# Patient Record
Sex: Male | Born: 1945 | Race: White | Hispanic: No | Marital: Married | Smoking: Never smoker
Health system: Southern US, Community
[De-identification: ages and names within clinical notes are randomized; demographics above are authoritative.]

## PROBLEM LIST (undated history)

## (undated) DIAGNOSIS — I77810 Thoracic aortic ectasia: Secondary | ICD-10-CM

## (undated) DIAGNOSIS — E119 Type 2 diabetes mellitus without complications: Secondary | ICD-10-CM

## (undated) DIAGNOSIS — A809 Acute poliomyelitis, unspecified: Secondary | ICD-10-CM

## (undated) DIAGNOSIS — I10 Essential (primary) hypertension: Secondary | ICD-10-CM

## (undated) DIAGNOSIS — J449 Chronic obstructive pulmonary disease, unspecified: Secondary | ICD-10-CM

## (undated) DIAGNOSIS — E785 Hyperlipidemia, unspecified: Secondary | ICD-10-CM

## (undated) DIAGNOSIS — K219 Gastro-esophageal reflux disease without esophagitis: Secondary | ICD-10-CM

## (undated) DIAGNOSIS — I219 Acute myocardial infarction, unspecified: Secondary | ICD-10-CM

## (undated) DIAGNOSIS — I2510 Atherosclerotic heart disease of native coronary artery without angina pectoris: Secondary | ICD-10-CM

## (undated) HISTORY — DX: Acute poliomyelitis, unspecified: A80.9

## (undated) HISTORY — DX: Chronic obstructive pulmonary disease, unspecified: J44.9

## (undated) HISTORY — DX: Acute myocardial infarction, unspecified: I21.9

## (undated) HISTORY — DX: Type 2 diabetes mellitus without complications: E11.9

## (undated) HISTORY — DX: Gastro-esophageal reflux disease without esophagitis: K21.9

## (undated) HISTORY — DX: Thoracic aortic ectasia: I77.810

## (undated) HISTORY — DX: Atherosclerotic heart disease of native coronary artery without angina pectoris: I25.10

## (undated) HISTORY — DX: Essential (primary) hypertension: I10

## (undated) HISTORY — PX: BACK SURGERY: SHX140

## (undated) HISTORY — DX: Hyperlipidemia, unspecified: E78.5

## (undated) HISTORY — PX: CARDIAC SURGERY: SHX584

---

## 1994-08-31 HISTORY — PX: CORONARY ARTERY BYPASS GRAFT: SHX141

## 1999-03-31 ENCOUNTER — Ambulatory Visit (HOSPITAL_COMMUNITY): Admission: RE | Admit: 1999-03-31 | Discharge: 1999-03-31 | Payer: Self-pay | Admitting: Cardiology

## 2000-08-31 HISTORY — PX: BACK SURGERY: SHX140

## 2000-12-03 ENCOUNTER — Encounter: Admission: RE | Admit: 2000-12-03 | Discharge: 2000-12-03 | Payer: Self-pay | Admitting: Family Medicine

## 2000-12-03 ENCOUNTER — Encounter: Payer: Self-pay | Admitting: Family Medicine

## 2001-01-05 ENCOUNTER — Encounter: Payer: Self-pay | Admitting: Neurosurgery

## 2001-01-06 ENCOUNTER — Ambulatory Visit (HOSPITAL_COMMUNITY): Admission: RE | Admit: 2001-01-06 | Discharge: 2001-01-07 | Payer: Self-pay | Admitting: Neurosurgery

## 2001-01-06 ENCOUNTER — Encounter: Payer: Self-pay | Admitting: Neurosurgery

## 2003-06-03 ENCOUNTER — Emergency Department (HOSPITAL_COMMUNITY): Admission: EM | Admit: 2003-06-03 | Discharge: 2003-06-04 | Payer: Self-pay | Admitting: Emergency Medicine

## 2003-07-13 ENCOUNTER — Encounter: Admission: RE | Admit: 2003-07-13 | Discharge: 2003-07-13 | Payer: Self-pay | Admitting: Family Medicine

## 2009-08-31 HISTORY — PX: CARDIAC CATHETERIZATION: SHX172

## 2009-10-26 ENCOUNTER — Inpatient Hospital Stay (HOSPITAL_COMMUNITY): Admission: EM | Admit: 2009-10-26 | Discharge: 2009-10-28 | Payer: Self-pay | Admitting: Emergency Medicine

## 2010-10-26 IMAGING — CR DG CHEST 2V
2 series · 2 of 2 positions shown · non-contrast
Comparison: Report dated 07/13/2003.

CLINICAL DATA: Generalized chest pain.

CHEST - 2 VIEW

[w chest pa]
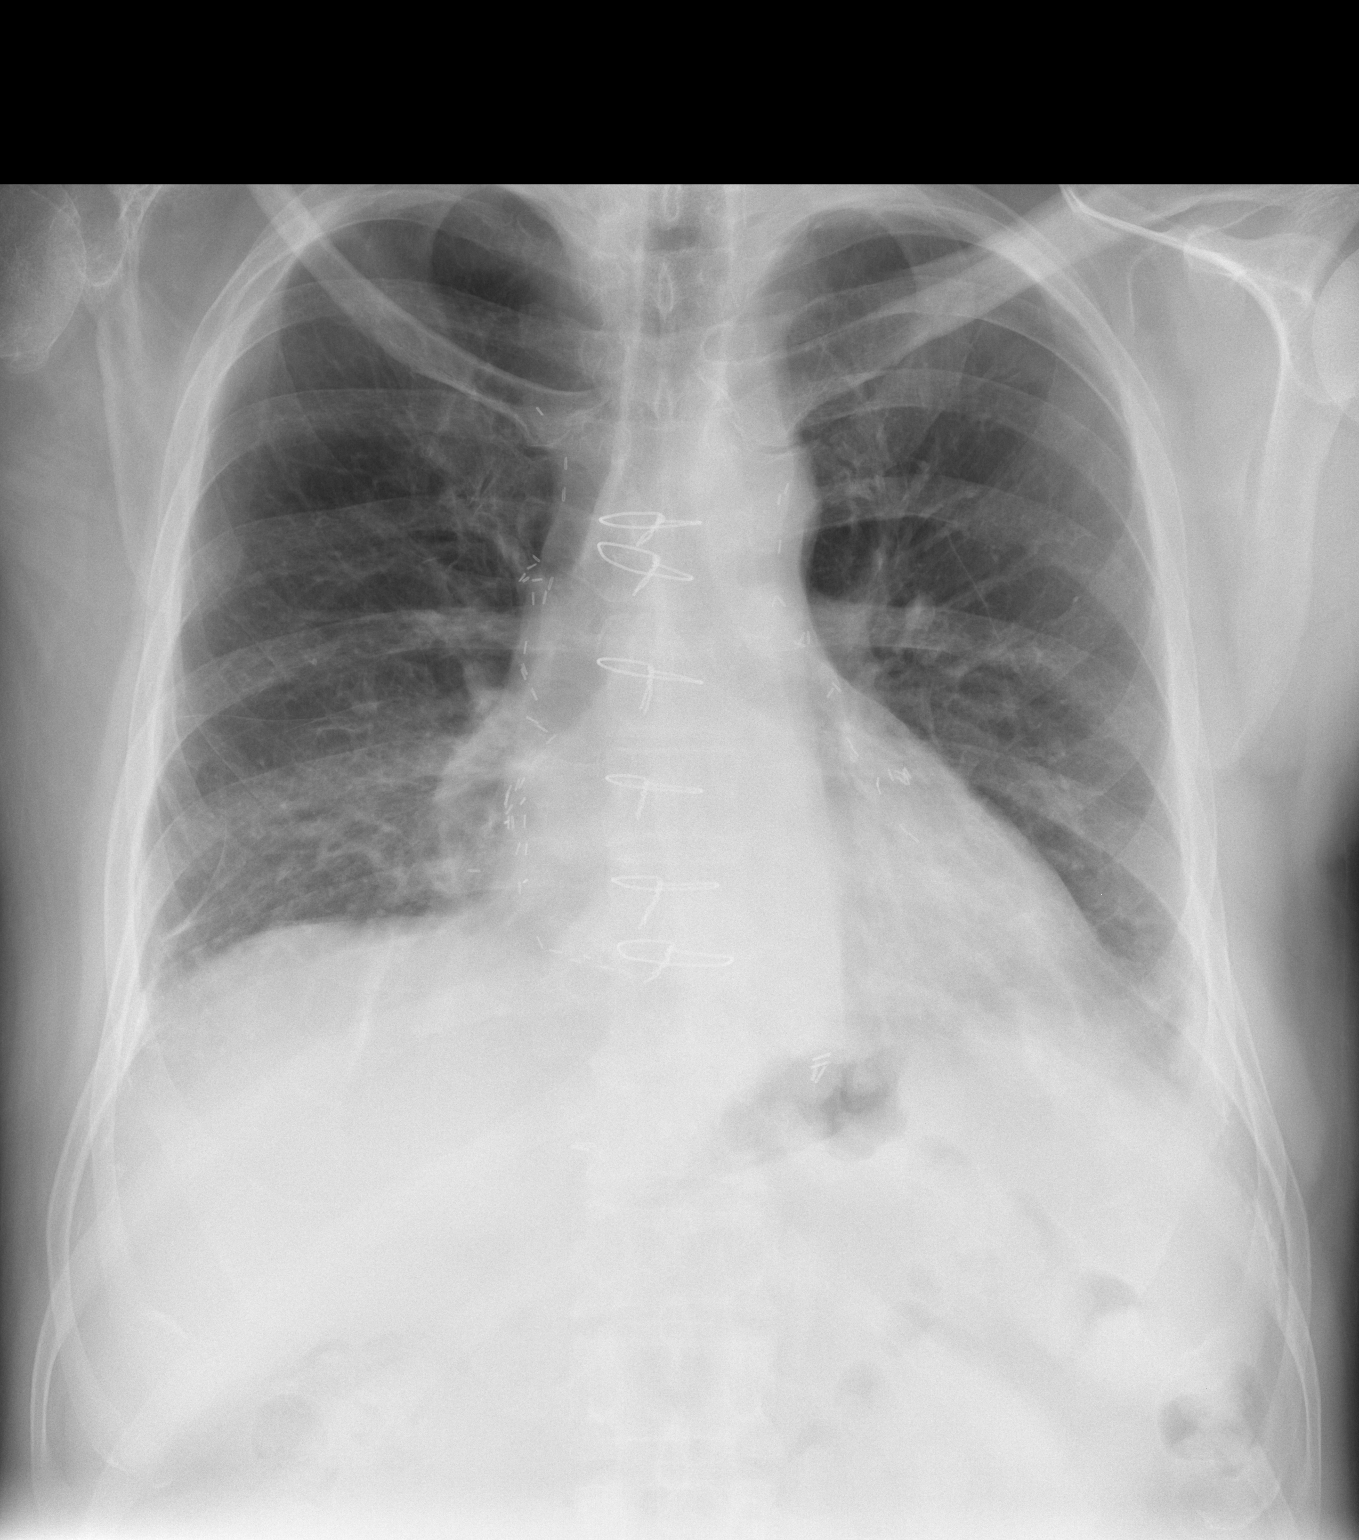

[w chest lat]
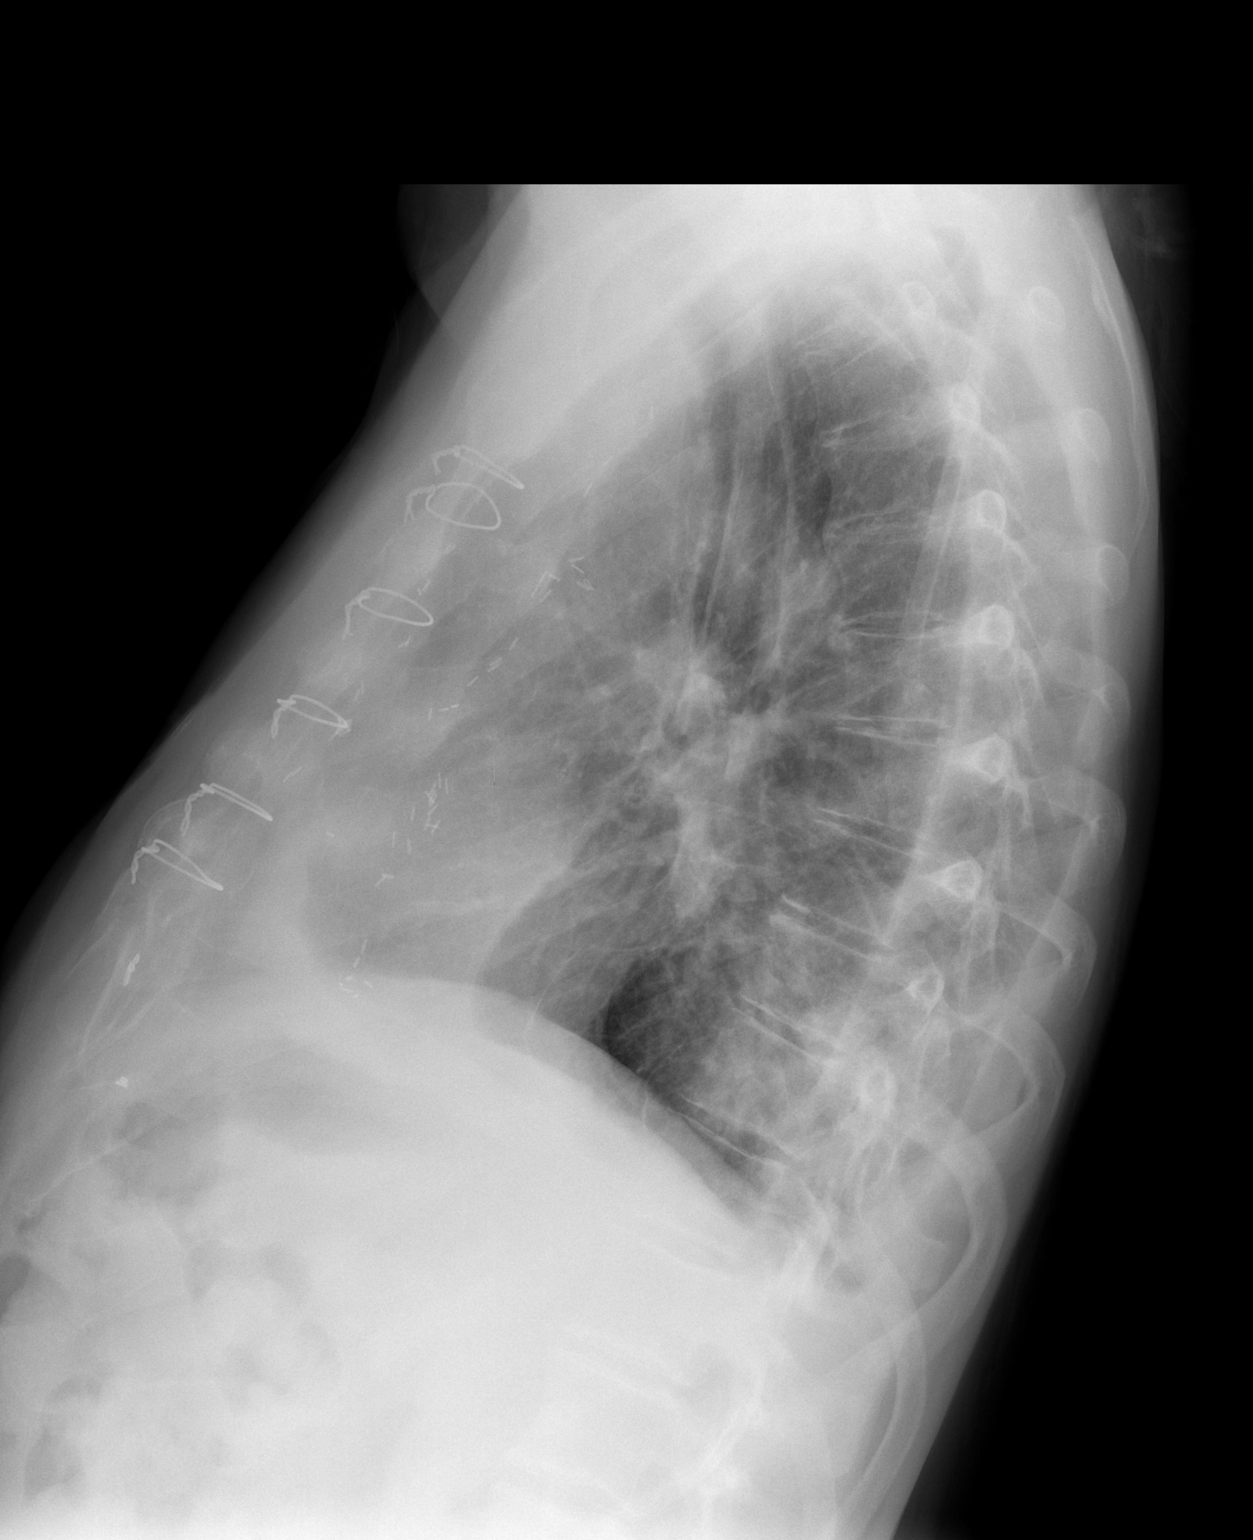

[2 of 2 positions shown; findings below may reference images not displayed]

FINDINGS: Borderline enlarged cardiac silhouette.  Post CABG
changes.  Linear densities at both lung bases.  There is also ill-
defined density at the posterior lung bases on the lateral view.
Small amount of bilateral pleural thickening or fluid.  Diffuse
osteopenia.  Mild thoracic spine degenerative changes peri
IMPRESSION: 1.  Posterior basilar atelectasis, scarring or pneumonia on the
lateral view.
2.  Bibasilar atelectasis or scarring on the frontal view.
3.  Small amount of bilateral pleural thickening or fluid.

## 2010-11-19 LAB — BASIC METABOLIC PANEL
BUN: 17 mg/dL (ref 6–23)
Chloride: 108 mEq/L (ref 96–112)
GFR calc non Af Amer: >60 mL/min (ref >60)
Potassium: 3.7 mEq/L (ref 3.5–5.1)
Sodium: 139 mEq/L (ref 135–145)

## 2010-11-19 LAB — CARDIAC PANEL(CRET KIN+CKTOT+MB+TROPI)
CK, MB: 0.7 ng/mL (ref 0.3–4.0)
Troponin I: 0.01 ng/mL (ref 0.00–0.06)

## 2010-11-19 LAB — CBC
HCT: 45.2 % (ref 39.0–52.0)
HCT: 49.5 % (ref 39.0–52.0)
Hemoglobin: 15.9 g/dL (ref 13.0–17.0)
Hemoglobin: 17.3 g/dL — ABNORMAL HIGH (ref 13.0–17.0)
MCHC: 35 g/dL (ref 30.0–36.0)
MCV: 92.8 fL (ref 78.0–100.0)
MCV: 93.1 fL (ref 78.0–100.0)
Platelets: 173 10*3/uL (ref 150–400)
Platelets: 192 10*3/uL (ref 150–400)
RBC: 4.87 MIL/uL (ref 4.22–5.81)
RBC: 5.31 MIL/uL (ref 4.22–5.81)
RDW: 12.7 % (ref 11.5–15.5)
WBC: 10.2 10*3/uL (ref 4.0–10.5)
WBC: 10.6 10*3/uL — ABNORMAL HIGH (ref 4.0–10.5)

## 2010-11-19 LAB — GLUCOSE, CAPILLARY
Glucose-Capillary: 101 mg/dL — ABNORMAL HIGH (ref 70–99)
Glucose-Capillary: 74 mg/dL (ref 70–99)

## 2010-11-19 LAB — MAGNESIUM: Magnesium: 1.8 mg/dL (ref 1.5–2.5)

## 2010-11-19 LAB — DIFFERENTIAL
Basophils Absolute: 0.1 10*3/uL (ref 0.0–0.1)
Basophils Relative: 1 % (ref 0–1)
Neutro Abs: 7.1 10*3/uL (ref 1.7–7.7)
Neutrophils Relative %: 70 % (ref 43–77)

## 2010-11-19 LAB — COMPREHENSIVE METABOLIC PANEL
Alkaline Phosphatase: 91 U/L (ref 39–117)
BUN: 10 mg/dL (ref 6–23)
CO2: 24 mEq/L (ref 19–32)
Chloride: 103 mEq/L (ref 96–112)
Glucose, Bld: 187 mg/dL — ABNORMAL HIGH (ref 70–99)
Potassium: 4.2 mEq/L (ref 3.5–5.1)
Total Bilirubin: 1.2 mg/dL (ref 0.3–1.2)

## 2010-11-19 LAB — PROTIME-INR: INR: 1 (ref 0.00–1.49)

## 2010-11-19 LAB — TROPONIN I: Troponin I: 0.04 ng/mL (ref 0.00–0.06)

## 2010-11-19 LAB — LIPID PANEL
HDL: 30 mg/dL — ABNORMAL LOW (ref >39)
LDL Cholesterol: 175 mg/dL — ABNORMAL HIGH (ref 0–99)
Triglycerides: 182 mg/dL — ABNORMAL HIGH (ref <150)

## 2010-11-19 LAB — POCT CARDIAC MARKERS: Troponin i, poc: <0.05 ng/mL (ref 0.00–0.09)

## 2010-11-19 LAB — CK TOTAL AND CKMB (NOT AT ARMC)
CK, MB: 1 ng/mL (ref 0.3–4.0)
Total CK: 41 U/L (ref 7–232)

## 2010-11-19 LAB — APTT: aPTT: 30 seconds (ref 24–37)

## 2010-11-19 LAB — HEMOGLOBIN A1C: Hgb A1c MFr Bld: 8.2 % — ABNORMAL HIGH (ref 4.6–6.1)

## 2011-01-16 NOTE — H&P (Signed)
[place]. [facility]  Patient:    Edward Russo, Edward Russo                       MRN: [id number] Adm. Date:  [id number] Attending:  Colon Branch                         History and Physical  CHIEF COMPLAINT:  Right leg pain.  HISTORY:  Patient is a 65 year old gentleman who for the last three months or so has had a right-sided back, hip, and leg pain, been going down behind the knee, occasionally going to the bottom of the foot, intermittent numbness on the bottom of the foot.  There has been worsening over this period of time despite nonsteroidal anti-inflammatories and pain medication.  No left-sided symptoms. Leg pain is more severe than any back pain.  Denies any weakness. No bowel or bladder complaints.  MRI was done showing disk herniation right side at L5-S1 compressing the S1 root and patient admitted for surgery.  PAST MEDICAL HISTORY:  Significant for hypertension, non-insulin-dependent diabetes, and coronary artery disease.  PREVIOUS OPERATIONS:  Double bypass in 1996.  He has a stress test and another catheterization two years ago and then he did undergo cardiac consultation one week ago and was cleared for surgery.  CURRENT MEDICATIONS:  Lipitor, Cardizem, and Amaryl.  DRUG ALLERGIES:  CODEINE - which actually causes constipation, not a real allergy.  SOCIAL HISTORY:  Shows he is married, employed as an Airline pilot, and does not smoke or drink alcohol.  REVIEW OF SYSTEMS:  Otherwise negative.  FAMILY HISTORY:  Noncontributory.  PHYSICAL EXAMINATION:  GENERAL:  Patient sits leaning to the left in some mild distress.  VITAL SIGNS:  Weight 195, height 5 feet 10 inches.  Blood pressure 144/90, pulse 62, temperature 98.5.  HEENT:  Unremarkable.  LUNGS:  Clear.  HEART:  Regular rhythm.  ABDOMEN:  Soft, nontender.  EXTREMITIES:  Intact and no edema.  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  BACK AND  NEUROLOGIC:  Show decreased range of motion of the back in extension due to increasing radicular pain.  Straight leg raising is positive on the right, negative on the left.  Motor strength shows 5/5 in all muscle groups and he can get up on his toes 10 times on each foot though with a little more effort on the right side.  Sensation is slightly decreased to light touch in the right S1 distribution; otherwise intact.  He is intact to pinprick.  Deep tendon reflexes are equal bilaterally at the ankles and knees.  Gait is done fairly normally.  ASSESSMENT AND PLAN:  Patient with a herniated disk right L5-S1 with right S1 radiculopathy and patient admitted for surgery. DD:  01/06/01 TD:  01/07/01 Job: [id number]

## 2011-01-16 NOTE — Discharge Summary (Signed)
[place]. [facility]  Patient:    Edward Russo, Edward Russo                       MRN: [id number] Adm. Date:  [id number] Disc. Date: [id number] Attending:  Colon Branch                           Discharge Summary  NO DICTATION DD:  02/01/01 TD:  02/01/01 Job: [id number]

## 2011-01-16 NOTE — Cardiovascular Report (Signed)
[place]. [facility]  Patient:    Edward Russo, Edward Russo                       MRN: [id number] Proc. Date: 03/31/99 Adm. Date:  [id number] Disc. Date: [id number] Attending:  Colon Branch                        Cardiac Catheterization  PROCEDURE PERFORMED: 1. Selective coronary angiography by Judkins technique. 2. Retrograde left heart catheterization. 3. Left ventricular angiography. 4. Selective visualization of a right internal mammary artery graft and a left    internal mammary artery graft.  COMPLICATIONS:  None.  ENTRY SITE:  Right femoral.  DYE USED:  Omnipaque.  HISTORY OF PRESENT ILLNESS:  The patient is a 65 year old gentleman with coronary artery disease and coronary artery bypass grafting in 1996. Cardiolite stress testing on March 27, 1999, showed a left ventricular ejection fraction of 57% and suggested evidence of ischemia in the left anterior descending coronary artery territory. This cardiac catheterization was therefore performed. The patient did have angioplasty in two sites of his right coronary artery.  RESULTS: 1. Pressures:  The left ventricular pressure was 144/22, central aortic    pressure was 130/81. 2. During left ventricular to central aorta pullback, there was no gradient    noted, however.  ANGIOGRAPHIC RESULTS:  The left main coronary artery was short with no disease noted.  The left anterior descending coronary artery was a small vessel. There was diffuse disease in this vessel. There was noted to be a distal LAD well filled by a patent internal mammary artery graft that could be seen to fill in a retrograde fashion.  The left circumflex coronary artery gave rise to an intermediate ______ branch and was a nondominant vessel. There was felt to be a moderate ostial stenosis in this nondominant vessel of somewhere less than 75%.  The right coronary artery was diseased in its midportion, and there was disease noted in the  posterior descending branch, as well.  The left internal mammary artery graft was patent.  The right internal mammary artery graft to the distal RCA was also patent.  Left ventricular ejection fraction was calculated at 72%.  FINAL IMPRESSIONS: 1. Coronary artery disease involving two vessels severely, specifically the    left anterior descending and the right coronary artery. 2. Well-preserved left ventricular systolic function. 3. Patent internal mammary artery grafts to the RCA and to the LAD. 4. Nondominant circumflex system with mild disease present.  PLAN:  Medical therapy. DD:  02/01/01 TD:  02/01/01 Job: [id number]

## 2011-01-16 NOTE — Op Note (Signed)
[place]. [facility]  Patient:    Edward Russo, Edward Russo                       MRN: [id number] Proc. Date: 01/06/01 Adm. Date:  [id number] Attending:  Colon Branch                           Operative Report  PREOPERATIVE DIAGNOSIS:  Herniated nucleus pulposus, right L5-S1.  POSTOPERATIVE DIAGNOSIS:  Herniated nucleus pulposus, right L5-S1.  PROCEDURE:  Right L5-S1 semihemilaminectomy and diskectomy, microdissection with microscope.  SURGEON:  Clydene Fake, M.D.  ASSISTANT:  Izell Geneva. Elesa Hacker, M.D.  ANESTHESIA:  General endotracheal tube anesthesia.  ESTIMATED BLOOD LOSS:  Minimal.  BLOOD GIVEN:  None.  DRAINS:  None.  COMPLICATIONS:  None.  REASON FOR PROCEDURE:  Patient is a 65 year old gentleman who has had three months of right leg pain in an S1 distribution with some intermittent numbness.  An MRI showed a corresponding disk herniation at the right side of L5-S1.  Patient brought in for surgery.  DESCRIPTION OF PROCEDURE:  The patient was brought into the operating room, and general anesthesia was induced.  The patient was placed in a prone position on a Wilson frame and all pressure points padded.  The patient was prepped and draped in a sterile fashion.  A needle was placed at the 5-1 interspace, x-ray obtained showing that this was the correct level.  An incision was made centered over this area.  Incision taken down to the fascia and hemostasis obtained with Bovie cauterization.  The right side of the fascia was incised with the Bovie, and subperiosteal dissection was done over the L5 and S1 spinous processes and laminae out to the facet.  A marker was placed at the interspace, and an x-ray was obtained again, confirming this was the 5-1 interspace.  The microscope was brought in for microdissection at this point, and the high-speed drill was used to start the semihemilaminectomy.  A Kerrison punch was used to finish this, and the ligamentum  flavum was then removed, and we were looking at the dura.  We dissected down to the S1 nerve root, and there was some tension on that.  As we carefully reflected the nerve root medially, there was a protrusion of disk coming out from the disk space. We were able to grab onto this protrusion and pull the disk material out.  We then incised the disk space and performed a diskectomy with pituitary rongeurs and curettes.  After we were satisfied with the diskectomy, we followed the L5 and S1 roots out, and there was no tension on it any longer.  We irrigated with antibiotic solution.  Hemostasis was obtained with bipolar cauterization and Gelfoam and thrombin, and Gelfoam was then irrigated out and one small piece was left just lateral to the S1 root.  The retractors were then removed.  The wound was irrigated with antibiotic solution, and the fascia was closed with 0 Vicryl interrupted sutures, and the subcutaneous tissue was closed with 0, 2-0, and 3-0 Vicryl interrupted sutures and the skin closed with Steri-Strips.  Dressing was placed.  The patient was placed back in the supine position, awoken from anesthesia, and transferred to recovery room in stable condition. DD:  01/06/01 TD:  01/07/01 Job: [id number]

## 2011-01-16 NOTE — Discharge Summary (Signed)
[place]. [facility]  Patient:    Edward Russo, Edward Russo                       MRN: [id number] Adm. Date:  [id number] Disc. Date: [id number] Attending:  Colon Branch                           Discharge Summary  ADMISSION DIAGNOSIS:  Herniated nucleus pulposus right L5-S1.  DISCHARGE DIAGNOSIS:  Herniated nucleus pulposus right L5-S1.  PROCEDURE:  Right L5-S1 semi-hemilaminectomy and diskectomy with microdissection with microscope.  REASON FOR ADMISSION:  The patient is a 65 year old gentleman who has had three months of right-sided back, hip and leg pain going towards the bottom of his foot with some numbness there.  The patient had an MRI showing right L5-S1 disc herniation and was brought in for surgery.  HOSPITAL COURSE:  The patient was brought in on the day of surgery and underwent the procedure above without complications.  Postop, the patient was transferred to the recovery room and then to the floor.  There he has been ambulating, eating, having much less leg pain.  He does have some pain over in the right tip, but even that is not as severe as preoperatively.  He does have some mild incisional pain.  He has been eating well.  His incision is clean, dry and intact.  Blood sugars have been stable.  Cardiac wise he has been stable.  He will be discharged home in stable condition.  DISCHARGE MEDICATIONS: 1. Resume medications as of prehospitalization. 2. Voltaren p.r.n. 3. Vicodin ES one to two p.o. q.4-6h. p.r.n., 51 given. 4. Flexeril 10 mg q.8h. p.r.n. spasms.  DIET:  As tolerated.  ACTIVITY:  No strenuous activity.  No sitting longer than 20 minutes.  No bending or twisting.  May ambulate.  WOUND CARE:  Keep incision dry for five days.  FOLLOWUP:  Follow up in three weeks with Dr. Phoebe Perch in his office. DD: 01/07/01 TD:  01/10/01 Job: [id number]

## 2014-02-14 ENCOUNTER — Emergency Department (HOSPITAL_COMMUNITY)
Admission: EM | Admit: 2014-02-14 | Discharge: 2014-02-14 | Disposition: A | Discharge status: Home / Self Care | Payer: Medicare HMO | Source: Home / Self Care | Attending: Family Medicine | Admitting: Family Medicine

## 2014-02-14 ENCOUNTER — Encounter (HOSPITAL_COMMUNITY): Payer: Self-pay | Admitting: Emergency Medicine

## 2014-02-14 DIAGNOSIS — W260XXA Contact with knife, initial encounter: Secondary | ICD-10-CM

## 2014-02-14 DIAGNOSIS — W261XXA Contact with sword or dagger, initial encounter: Secondary | ICD-10-CM

## 2014-02-14 DIAGNOSIS — S61411A Laceration without foreign body of right hand, initial encounter: Secondary | ICD-10-CM

## 2014-02-14 DIAGNOSIS — S61409A Unspecified open wound of unspecified hand, initial encounter: Secondary | ICD-10-CM

## 2014-02-14 HISTORY — DX: Essential (primary) hypertension: I10

## 2014-02-14 NOTE — Discharge Instructions (Signed)

## 2014-02-14 NOTE — ED Notes (Signed)
Pt  Sustained  A  Laceration   To  r hand       Cut  With     A  Razor  Blade        rom  Is  Present

## 2014-02-14 NOTE — ED Provider Notes (Signed)
CSN: [id number]     Arrival date & time 02/14/14  0907 History   First MD Initiated Contact with Patient 02/14/14 [phone]     Chief Complaint  Patient presents with  . Laceration   (Consider location/radiation/quality/duration/timing/severity/associated sxs/prior Treatment) Patient is a 68 y.o. male presenting with skin laceration. The history is provided by the patient. No language interpreter was used.  Laceration Location:  Hand Hand laceration location:  R hand Length (cm):  2 Quality: straight   Bleeding: controlled   Time since incident:  1 hour Laceration mechanism:  Knife Pain details:    Quality:  Aching   Severity:  Moderate   Progression:  Worsening Foreign body present:  No foreign bodies Relieved by:  Nothing Ineffective treatments:  None tried Tetanus status:  Up to date Pt cut his hand cutting up a credit card with a knife  Past Medical History  Diagnosis Date  . Hypertension    Past Surgical History  Procedure Laterality Date  . Cardiac surgery    . Back surgery     History reviewed. No pertinent family history. History  Substance Use Topics  . Smoking status: Never Smoker   . Smokeless tobacco: Not on file  . Alcohol Use: No    Review of Systems  Skin: Positive for wound.  All other systems reviewed and are negative.   Allergies  Review of patient's allergies indicates no known allergies.  Home Medications   Prior to Admission medications   Medication Sig Start Date End Date Taking? Authorizing Provider  atorvastatin (LIPITOR) 40 MG tablet Take 40 mg by mouth daily.   Yes Historical Provider, MD  CARVEDILOL PO Take by mouth.   Yes Historical Provider, MD  METFORMIN HCL PO Take by mouth.   Yes Historical Provider, MD  nitroGLYCERIN (NITROSTAT) 0.4 MG SL tablet Place 0.4 mg under the tongue every 5 (five) minutes as needed for chest pain.   Yes Historical Provider, MD  omeprazole (PRILOSEC) 20 MG capsule Take 20 mg by mouth daily.   Yes  Historical Provider, MD  Ranolazine (RANEXA PO) Take by mouth.   Yes Historical Provider, MD  tamsulosin (FLOMAX) 0.4 MG CAPS capsule Take 0.4 mg by mouth.   Yes Historical Provider, MD   BP 134/78  Pulse 77  Temp(Src) 97.2 F (36.2 C) (Oral)  Resp 18  SpO2 96% Physical Exam  Constitutional: He is oriented to person, place, and time. He appears well-developed and well-nourished.  HENT:  Head: Normocephalic.  Eyes: Pupils are equal, round, and reactive to light.  Neck: Normal range of motion.  Pulmonary/Chest: Effort normal.  Musculoskeletal:  1.5 cm laceration right hand  Neurological: He is alert and oriented to person, place, and time. He has normal reflexes.  Skin: Skin is warm.  Psychiatric: He has a normal mood and affect.    ED Course  LACERATION REPAIR Date/Time: 02/14/2014 9:49 AM Performed by: Elson AreasSOFIA, LESLIE K Authorized by: Clementeen GrahamOREY, EVAN, S Consent: Verbal consent not obtained. Patient understanding: patient does not state understanding of the procedure being performed Patient identity confirmed: verbally with patient Time out: Immediately prior to procedure a "time out" was called to verify the correct patient, procedure, equipment, support staff and site/side marked as required. Body area: upper extremity Laceration length: 1 cm Tendon involvement: none Nerve involvement: none Vascular damage: no Anesthesia: local infiltration Preparation: Patient was prepped and draped in the usual sterile fashion. Irrigation solution: saline Amount of cleaning: standard Skin closure: 5-0 Prolene Number of  sutures: 3 Technique: simple Approximation: close Approximation difficulty: simple   (including critical care time) Labs Review Labs Reviewed - No data to display  Imaging Review No results found.   MDM   1. Laceration of right hand        Elson AreasLeslie K Sofia, [place] JerseyPA-C 02/14/14 [id number]

## 2014-02-14 NOTE — ED Provider Notes (Signed)
Medical screening examination/treatment/procedure(s) were performed by a resident physician or non-physician practitioner and as the supervising physician I was immediately available for consultation/collaboration.  Zayvion Stailey, MD    Joaquim Tolen S Chrislyn Seedorf, MD 02/14/14 2013 

## 2014-02-22 ENCOUNTER — Emergency Department (INDEPENDENT_AMBULATORY_CARE_PROVIDER_SITE_OTHER)
Admission: EM | Admit: 2014-02-22 | Discharge: 2014-02-22 | Disposition: A | Discharge status: Home / Self Care | Payer: Commercial Managed Care - HMO | Source: Home / Self Care | Attending: Family Medicine | Admitting: Family Medicine

## 2014-02-22 ENCOUNTER — Encounter (HOSPITAL_COMMUNITY): Payer: Self-pay | Admitting: Emergency Medicine

## 2014-02-22 DIAGNOSIS — Z4802 Encounter for removal of sutures: Secondary | ICD-10-CM

## 2014-02-22 DIAGNOSIS — S61411D Laceration without foreign body of right hand, subsequent encounter: Secondary | ICD-10-CM

## 2014-02-22 NOTE — ED Notes (Signed)
C/o suture removal of stitches in the right palm hand States area feels better

## 2014-02-22 NOTE — ED Provider Notes (Signed)
Edward Russo is a 68 y.o. male who presents to Urgent Care today for  suture removal. Patient suffered a laceration to his right thenar eminence about one week ago. He is here today for suture removal. He denies any pain weakness or numbness fevers or chills.   Past Medical History  Diagnosis Date  . Hypertension    History  Substance Use Topics  . Smoking status: Never Smoker   . Smokeless tobacco: Not on file  . Alcohol Use: No   ROS as above Medications: No current facility-administered medications for this encounter.   Current Outpatient Prescriptions  Medication Sig Dispense Refill  . atorvastatin (LIPITOR) 40 MG tablet Take 40 mg by mouth daily.      Marland Kitchen. CARVEDILOL PO Take by mouth.      . METFORMIN HCL PO Take by mouth.      . nitroGLYCERIN (NITROSTAT) 0.4 MG SL tablet Place 0.4 mg under the tongue every 5 (five) minutes as needed for chest pain.      Marland Kitchen. omeprazole (PRILOSEC) 20 MG capsule Take 20 mg by mouth daily.      . Ranolazine (RANEXA PO) Take by mouth.      . tamsulosin (FLOMAX) 0.4 MG CAPS capsule Take 0.4 mg by mouth.        Exam:  BP 142/78  Pulse 64  Temp(Src) 97.9 F (36.6 C) (Oral)  Resp 14  SpO2 100% Gen: Well NAD Right hand: 1 cm thenar eminence laceration. No erythema or exudates.  3 simple interrupted sutures removed  No results found for this or any previous visit (from the past 24 hour(s)). No results found.  Assessment and Plan: 68 y.o. male with suture removal. Wound care instructions provided. Followup as needed  Discussed warning signs or symptoms. Please see discharge instructions. Patient expresses understanding.    Rodolph BongEvan S Corey, MD 02/22/14 1131

## 2014-02-22 NOTE — Discharge Instructions (Signed)
Thank you for coming in today. ° ° °Scar Minimization °You will have a scar anytime you have surgery and a cut is made in the skin or you have something removed from your skin (mole, skin cancer, cyst). Although scars are unavoidable following surgery, there are ways to minimize their appearance. °It is important to follow all the instructions you receive from your caregiver about wound care. How your wound heals will influence the appearance of your scar. If you do not follow the wound care instructions as directed, complications such as infection may occur. Wound instructions include keeping the wound clean, moist, and not letting the wound form a scab. Some people form scars that are raised and lumpy (hypertrophic) or larger than the initial wound (keloidal). °HOME CARE INSTRUCTIONS  °· Follow wound care instructions as directed. °· Keep the wound clean by washing it with soap and water. °· Keep the wound moist with provided antibiotic cream or petroleum jelly until completely healed. Moisten twice a day for about 2 weeks. °· Get stitches (sutures) taken out at the scheduled time. °· Avoid touching or manipulating your wound unless needed. Wash your hands thoroughly before and after touching your wound. °· Follow all restrictions such as limits on exercise or work. This depends on where your scar is located. °· Keep the scar protected from sunburn. Cover the scar with sunscreen/sunblock with SPF 30 or higher. °· Gently massage the scar using a circular motion to help minimize the appearance of the scar. Do this only after the wound has closed and all the sutures have been removed. °· For hypertrophic or keloidal scars, there are several ways to treat and minimize their appearance. Methods include compression therapy, intralesional corticosteroids, laser therapy, or surgery. These methods are performed by your caregiver. °Remember that the scar may appear lighter or darker than your normal skin color. This  difference in color should even out with time. °SEEK MEDICAL CARE IF:  °· You have a fever. °· You develop signs of infection such as pain, redness, pus, and warmth. °· You have questions or concerns. °Document Released: 02/04/2010 Document Revised: 11/09/2011 Document Reviewed: 02/04/2010 °[employer]® Patient Information ©2015 [employer]. This information is not intended to replace advice given to you by your health care provider. Make sure you discuss any questions you have with your health care provider. ° °

## 2015-06-12 DIAGNOSIS — E1169 Type 2 diabetes mellitus with other specified complication: Secondary | ICD-10-CM | POA: Insufficient documentation

## 2015-06-12 DIAGNOSIS — I77810 Thoracic aortic ectasia: Secondary | ICD-10-CM

## 2015-06-12 DIAGNOSIS — E785 Hyperlipidemia, unspecified: Secondary | ICD-10-CM

## 2015-06-12 DIAGNOSIS — I10 Essential (primary) hypertension: Secondary | ICD-10-CM

## 2015-06-12 DIAGNOSIS — I2510 Atherosclerotic heart disease of native coronary artery without angina pectoris: Secondary | ICD-10-CM | POA: Insufficient documentation

## 2015-06-12 DIAGNOSIS — E1159 Type 2 diabetes mellitus with other circulatory complications: Secondary | ICD-10-CM | POA: Insufficient documentation

## 2015-06-12 HISTORY — DX: Atherosclerotic heart disease of native coronary artery without angina pectoris: I25.10

## 2015-06-12 HISTORY — DX: Hyperlipidemia, unspecified: E78.5

## 2015-06-12 HISTORY — DX: Essential (primary) hypertension: I10

## 2015-06-12 HISTORY — DX: Thoracic aortic ectasia: I77.810

## 2015-11-21 DIAGNOSIS — I119 Hypertensive heart disease without heart failure: Secondary | ICD-10-CM | POA: Diagnosis not present

## 2015-11-21 DIAGNOSIS — Z7984 Long term (current) use of oral hypoglycemic drugs: Secondary | ICD-10-CM | POA: Diagnosis not present

## 2015-11-21 DIAGNOSIS — G14 Postpolio syndrome: Secondary | ICD-10-CM | POA: Diagnosis not present

## 2015-11-21 DIAGNOSIS — I25110 Atherosclerotic heart disease of native coronary artery with unstable angina pectoris: Secondary | ICD-10-CM | POA: Diagnosis not present

## 2015-11-21 DIAGNOSIS — N400 Enlarged prostate without lower urinary tract symptoms: Secondary | ICD-10-CM | POA: Diagnosis not present

## 2015-11-21 DIAGNOSIS — E782 Mixed hyperlipidemia: Secondary | ICD-10-CM | POA: Diagnosis not present

## 2015-11-21 DIAGNOSIS — E119 Type 2 diabetes mellitus without complications: Secondary | ICD-10-CM | POA: Diagnosis not present

## 2015-11-21 DIAGNOSIS — Z0000 Encounter for general adult medical examination without abnormal findings: Secondary | ICD-10-CM | POA: Diagnosis not present

## 2015-11-21 DIAGNOSIS — J449 Chronic obstructive pulmonary disease, unspecified: Secondary | ICD-10-CM | POA: Diagnosis not present

## 2016-05-26 DIAGNOSIS — I119 Hypertensive heart disease without heart failure: Secondary | ICD-10-CM | POA: Diagnosis not present

## 2016-05-26 DIAGNOSIS — B359 Dermatophytosis, unspecified: Secondary | ICD-10-CM | POA: Diagnosis not present

## 2016-05-26 DIAGNOSIS — E119 Type 2 diabetes mellitus without complications: Secondary | ICD-10-CM | POA: Diagnosis not present

## 2016-05-26 DIAGNOSIS — E782 Mixed hyperlipidemia: Secondary | ICD-10-CM | POA: Diagnosis not present

## 2016-05-26 DIAGNOSIS — Z23 Encounter for immunization: Secondary | ICD-10-CM | POA: Diagnosis not present

## 2016-05-26 DIAGNOSIS — G14 Postpolio syndrome: Secondary | ICD-10-CM | POA: Diagnosis not present

## 2016-05-26 DIAGNOSIS — J449 Chronic obstructive pulmonary disease, unspecified: Secondary | ICD-10-CM | POA: Diagnosis not present

## 2016-05-26 DIAGNOSIS — I25110 Atherosclerotic heart disease of native coronary artery with unstable angina pectoris: Secondary | ICD-10-CM | POA: Diagnosis not present

## 2016-07-07 DIAGNOSIS — E785 Hyperlipidemia, unspecified: Secondary | ICD-10-CM | POA: Diagnosis not present

## 2016-07-07 DIAGNOSIS — I10 Essential (primary) hypertension: Secondary | ICD-10-CM | POA: Diagnosis not present

## 2016-07-07 DIAGNOSIS — I77810 Thoracic aortic ectasia: Secondary | ICD-10-CM | POA: Diagnosis not present

## 2016-07-07 DIAGNOSIS — I25119 Atherosclerotic heart disease of native coronary artery with unspecified angina pectoris: Secondary | ICD-10-CM | POA: Diagnosis not present

## 2016-08-04 DIAGNOSIS — I25119 Atherosclerotic heart disease of native coronary artery with unspecified angina pectoris: Secondary | ICD-10-CM | POA: Diagnosis not present

## 2016-12-30 DIAGNOSIS — N400 Enlarged prostate without lower urinary tract symptoms: Secondary | ICD-10-CM | POA: Diagnosis not present

## 2016-12-30 DIAGNOSIS — Z1159 Encounter for screening for other viral diseases: Secondary | ICD-10-CM | POA: Diagnosis not present

## 2016-12-30 DIAGNOSIS — I119 Hypertensive heart disease without heart failure: Secondary | ICD-10-CM | POA: Diagnosis not present

## 2016-12-30 DIAGNOSIS — I25110 Atherosclerotic heart disease of native coronary artery with unstable angina pectoris: Secondary | ICD-10-CM | POA: Diagnosis not present

## 2016-12-30 DIAGNOSIS — Z1211 Encounter for screening for malignant neoplasm of colon: Secondary | ICD-10-CM | POA: Diagnosis not present

## 2016-12-30 DIAGNOSIS — E119 Type 2 diabetes mellitus without complications: Secondary | ICD-10-CM | POA: Diagnosis not present

## 2016-12-30 DIAGNOSIS — E782 Mixed hyperlipidemia: Secondary | ICD-10-CM | POA: Diagnosis not present

## 2016-12-30 DIAGNOSIS — Z0000 Encounter for general adult medical examination without abnormal findings: Secondary | ICD-10-CM | POA: Diagnosis not present

## 2016-12-30 DIAGNOSIS — Z125 Encounter for screening for malignant neoplasm of prostate: Secondary | ICD-10-CM | POA: Diagnosis not present

## 2016-12-30 DIAGNOSIS — J449 Chronic obstructive pulmonary disease, unspecified: Secondary | ICD-10-CM | POA: Diagnosis not present

## 2016-12-30 DIAGNOSIS — G14 Postpolio syndrome: Secondary | ICD-10-CM | POA: Diagnosis not present

## 2017-07-16 DIAGNOSIS — Z7984 Long term (current) use of oral hypoglycemic drugs: Secondary | ICD-10-CM | POA: Diagnosis not present

## 2017-07-16 DIAGNOSIS — J449 Chronic obstructive pulmonary disease, unspecified: Secondary | ICD-10-CM | POA: Diagnosis not present

## 2017-07-16 DIAGNOSIS — I119 Hypertensive heart disease without heart failure: Secondary | ICD-10-CM | POA: Diagnosis not present

## 2017-07-16 DIAGNOSIS — E119 Type 2 diabetes mellitus without complications: Secondary | ICD-10-CM | POA: Diagnosis not present

## 2017-07-16 DIAGNOSIS — I25110 Atherosclerotic heart disease of native coronary artery with unstable angina pectoris: Secondary | ICD-10-CM | POA: Diagnosis not present

## 2017-07-16 DIAGNOSIS — Z23 Encounter for immunization: Secondary | ICD-10-CM | POA: Diagnosis not present

## 2017-07-16 DIAGNOSIS — E782 Mixed hyperlipidemia: Secondary | ICD-10-CM | POA: Diagnosis not present

## 2017-07-16 DIAGNOSIS — H9190 Unspecified hearing loss, unspecified ear: Secondary | ICD-10-CM | POA: Diagnosis not present

## 2017-07-16 DIAGNOSIS — G14 Postpolio syndrome: Secondary | ICD-10-CM | POA: Diagnosis not present

## 2017-08-05 DIAGNOSIS — H2513 Age-related nuclear cataract, bilateral: Secondary | ICD-10-CM | POA: Diagnosis not present

## 2017-08-05 DIAGNOSIS — H52223 Regular astigmatism, bilateral: Secondary | ICD-10-CM | POA: Diagnosis not present

## 2017-08-05 DIAGNOSIS — E119 Type 2 diabetes mellitus without complications: Secondary | ICD-10-CM | POA: Diagnosis not present

## 2017-08-05 DIAGNOSIS — H524 Presbyopia: Secondary | ICD-10-CM | POA: Diagnosis not present

## 2017-08-05 DIAGNOSIS — H5203 Hypermetropia, bilateral: Secondary | ICD-10-CM | POA: Diagnosis not present

## 2017-10-04 DIAGNOSIS — K219 Gastro-esophageal reflux disease without esophagitis: Secondary | ICD-10-CM

## 2017-10-04 DIAGNOSIS — J449 Chronic obstructive pulmonary disease, unspecified: Secondary | ICD-10-CM

## 2017-10-04 DIAGNOSIS — E119 Type 2 diabetes mellitus without complications: Secondary | ICD-10-CM | POA: Insufficient documentation

## 2017-10-04 HISTORY — DX: Chronic obstructive pulmonary disease, unspecified: J44.9

## 2017-10-04 HISTORY — DX: Gastro-esophageal reflux disease without esophagitis: K21.9

## 2017-10-04 HISTORY — DX: Type 2 diabetes mellitus without complications: E11.9

## 2017-10-28 ENCOUNTER — Encounter: Payer: Self-pay | Admitting: Cardiology

## 2017-10-28 ENCOUNTER — Ambulatory Visit (INDEPENDENT_AMBULATORY_CARE_PROVIDER_SITE_OTHER): Payer: PPO | Admitting: Cardiology

## 2017-10-28 VITALS — BP 124/76 | HR 64 | Ht 70.0 in | Wt 165.0 lb

## 2017-10-28 DIAGNOSIS — I25118 Atherosclerotic heart disease of native coronary artery with other forms of angina pectoris: Secondary | ICD-10-CM

## 2017-10-28 DIAGNOSIS — I10 Essential (primary) hypertension: Secondary | ICD-10-CM

## 2017-10-28 DIAGNOSIS — E785 Hyperlipidemia, unspecified: Secondary | ICD-10-CM

## 2017-10-28 MED ORDER — NITROGLYCERIN 0.4 MG SL SUBL: 0.4000 mg | SUBLINGUAL_TABLET | SUBLINGUAL | 3 refills | Status: DC | PRN

## 2017-10-28 NOTE — Progress Notes (Signed)
Cardiology Office Note:    Date:  10/28/2017   ID:  Edward Russo, DOB 1946-03-11, MRN [id number]  PCP:  Patient, No Pcp Per  Cardiologist:  Norman Herrlich, MD    Referring MD: No ref. provider found    ASSESSMENT:    1. Coronary artery disease of native artery of native heart with stable angina pectoris (HCC)   2. Essential hypertension   3. Hyperlipidemia LDL goal <100    PLAN:    In order of problems listed above:  1. Stable continue current medical treatment.  We discussed the option of ischemia evaluation is pleased with the quality of his life is not having angina and prefers ongoing medical treatment and clinical observation.  If he is having anginal symptoms he will contact me otherwise I will see back in my office in 1 year 2. Stable blood pressure target continue current treatment 3. Stable continue with statin  atorvastatin high intensity   Next appointment: One year   Medication Adjustments/Labs and Tests Ordered: Current medicines are reviewed at length with the patient today.  Concerns regarding medicines are outlined above.  Orders Placed This Encounter  Procedures  . EKG 12-Lead   Meds ordered this encounter  Medications  . nitroGLYCERIN (NITROSTAT) 0.4 MG SL tablet    Sig: Place 1 tablet (0.4 mg total) under the tongue every 5 (five) minutes as needed for chest pain.    Dispense:  25 tablet    Refill:  3    Chief Complaint  Patient presents with  . Follow-up    Routine follow up appt   . Coronary Artery Disease    History of Present Illness:    Edward Russo is a 72 y.o. 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 Compliance with diet, lifestyle and medications: Yes Remains active he has variable shortness of breath not severe limiting his had no angina dyspnea palpitation or syncope.  Recent labs are reassuring with hemoglobin of 6.8 and I reviewed his lipid profile  which is at target with care everywhere. Past Medical History:  Diagnosis Date  . Aortic ectasia, thoracic (HCC) 06/12/2015  . COPD (chronic obstructive pulmonary disease) (HCC) 10/04/2017  . Coronary artery disease involving native coronary artery of native heart 06/12/2015  . Esophageal reflux 10/04/2017  . Essential hypertension 06/12/2015  . Hyperlipidemia LDL goal <100 06/12/2015  . Hypertension   . Polio   . Type 2 diabetes mellitus (HCC) 10/04/2017    Past Surgical History:  Procedure Laterality Date  . BACK SURGERY    . CARDIAC CATHETERIZATION  2011   patent LTA and RTA at cath in 2011   . CARDIAC SURGERY    . CORONARY ARTERY BYPASS GRAFT  1996    Current Medications: Current Meds  Medication Sig  . aspirin (GOODSENSE ASPIRIN) 325 MG tablet Take 325 mg by mouth daily.  Marland Kitchen atorvastatin (LIPITOR) 40 MG tablet Take 40 mg by mouth daily.  . carvedilol (COREG) 3.125 MG tablet Take 1 tablet by mouth 2 (two) times daily.   . metFORMIN (GLUCOPHAGE) 850 MG tablet Take 1 tablet by mouth daily.   . nitroGLYCERIN (NITROSTAT) 0.4 MG SL tablet Place 1 tablet (0.4 mg total) under the tongue every 5 (five) minutes as needed for chest pain.  Marland Kitchen omeprazole (PRILOSEC) 20 MG capsule Take 20 mg by mouth daily.  . tamsulosin (FLOMAX) 0.4 MG  CAPS capsule Take 0.4 mg by mouth daily.   . [DISCONTINUED] nitroGLYCERIN (NITROSTAT) 0.4 MG SL tablet Place 0.4 mg under the tongue every 5 (five) minutes as needed for chest pain.     Allergies:   Patient has no known allergies.   Social History   Socioeconomic History  . Marital status: Single    Spouse name: None  . Number of children: None  . Years of education: None  . Highest education level: None  Social Needs  . Financial resource strain: None  . Food insecurity - worry: None  . Food insecurity - inability: None  . Transportation needs - medical: None  . Transportation needs - non-medical: None  Occupational History  . None  Tobacco Use    . Smoking status: Never Smoker  . Smokeless tobacco: Never Used  Substance and Sexual Activity  . Alcohol use: No  . Drug use: No  . Sexual activity: None  Other Topics Concern  . None  Social History Narrative  . None     Family History: The patient's family history includes Asthma in his mother; CAD in his father; Heart attack in his brother; Heart failure in his mother. ROS:   Please see the history of present illness.    All other systems reviewed and are negative.  EKGs/Labs/Other Studies Reviewed:    The following studies were reviewed today:  EKG:  EKG ordered today.  The ekg ordered today demonstrates sinus rhythm normal  Recent Labs: No results found for requested labs within last 8760 hours.  Recent Lipid Panel    Component Value Date/Time   CHOL (H) 10/27/2009 0012    241        ATP III CLASSIFICATION:  <200     mg/dL   Desirable  161-096  mg/dL   Borderline High  >=045    mg/dL   High          TRIG 409 (H) 10/27/2009 0012   HDL 30 (L) 10/27/2009 0012   CHOLHDL 8.0 10/27/2009 0012   VLDL 36 10/27/2009 0012   LDLCALC (H) 10/27/2009 0012    175        Total Cholesterol/HDL:CHD Risk Coronary Heart Disease Risk Table                     Men   Women  1/2 Average Risk   3.4   3.3  Average Risk       5.0   4.4  2 X Average Risk   9.6   7.1  3 X Average Risk  23.4   11.0        Use the calculated Patient Ratio above and the CHD Risk Table to determine the patient's CHD Risk.        ATP III CLASSIFICATION (LDL):  <100     mg/dL   Optimal  811-914  mg/dL   Near or Above                    Optimal  130-159  mg/dL   Borderline  782-956  mg/dL   High  >213     mg/dL   Very High    Physical Exam:    VS:  BP 124/76 (BP Location: Left Arm, Patient Position: Sitting, Cuff Size: Normal)   Pulse 64   Ht 5\' 10"  (1.778 m)   Wt 165 lb (74.8 kg)   SpO2 96%   BMI 23.68 kg/m  Wt Readings from Last 3 Encounters:  10/28/17 165 lb (74.8 kg)     GEN:   Well nourished, well developed in no acute distress HEENT: Normal NECK: No JVD; No carotid bruits LYMPHATICS: No lymphadenopathy CARDIAC: RRR, no murmurs, rubs, gallops RESPIRATORY:  Clear to auscultation without rales, wheezing or rhonchi  ABDOMEN: Soft, non-tender, non-distended MUSCULOSKELETAL:  No edema; No deformity  SKIN: Warm and dry NEUROLOGIC:  Alert and oriented x 3 PSYCHIATRIC:  Normal affect    Signed, Norman HerrlichBrian Suhail Peloquin, MD  10/28/2017 2:43 PM    [facility]

## 2017-10-28 NOTE — Patient Instructions (Signed)

## 2018-01-10 DIAGNOSIS — Z1211 Encounter for screening for malignant neoplasm of colon: Secondary | ICD-10-CM | POA: Diagnosis not present

## 2018-01-10 DIAGNOSIS — I25110 Atherosclerotic heart disease of native coronary artery with unstable angina pectoris: Secondary | ICD-10-CM | POA: Diagnosis not present

## 2018-01-10 DIAGNOSIS — N400 Enlarged prostate without lower urinary tract symptoms: Secondary | ICD-10-CM | POA: Diagnosis not present

## 2018-01-10 DIAGNOSIS — G14 Postpolio syndrome: Secondary | ICD-10-CM | POA: Diagnosis not present

## 2018-01-10 DIAGNOSIS — E119 Type 2 diabetes mellitus without complications: Secondary | ICD-10-CM | POA: Diagnosis not present

## 2018-01-10 DIAGNOSIS — J449 Chronic obstructive pulmonary disease, unspecified: Secondary | ICD-10-CM | POA: Diagnosis not present

## 2018-01-10 DIAGNOSIS — Z7984 Long term (current) use of oral hypoglycemic drugs: Secondary | ICD-10-CM | POA: Diagnosis not present

## 2018-01-10 DIAGNOSIS — Z125 Encounter for screening for malignant neoplasm of prostate: Secondary | ICD-10-CM | POA: Diagnosis not present

## 2018-01-10 DIAGNOSIS — I119 Hypertensive heart disease without heart failure: Secondary | ICD-10-CM | POA: Diagnosis not present

## 2018-01-10 DIAGNOSIS — Z0000 Encounter for general adult medical examination without abnormal findings: Secondary | ICD-10-CM | POA: Diagnosis not present

## 2018-01-10 DIAGNOSIS — E782 Mixed hyperlipidemia: Secondary | ICD-10-CM | POA: Diagnosis not present

## 2018-01-27 ENCOUNTER — Telehealth: Payer: Self-pay

## 2018-01-27 MED ORDER — NITROGLYCERIN 0.4 MG SL SUBL: 0.4000 mg | SUBLINGUAL_TABLET | SUBLINGUAL | 3 refills | Status: DC | PRN

## 2018-01-27 NOTE — Telephone Encounter (Signed)
Rx for nitroglycerin 0.4 mg one tablet every 5 mins as needed for chest pain #25 sent to pharmacy as requested

## 2018-02-16 ENCOUNTER — Encounter: Payer: Self-pay | Admitting: Cardiology

## 2018-02-18 DIAGNOSIS — K0889 Other specified disorders of teeth and supporting structures: Secondary | ICD-10-CM | POA: Diagnosis not present

## 2018-07-18 DIAGNOSIS — I119 Hypertensive heart disease without heart failure: Secondary | ICD-10-CM | POA: Diagnosis not present

## 2018-07-18 DIAGNOSIS — E1169 Type 2 diabetes mellitus with other specified complication: Secondary | ICD-10-CM | POA: Diagnosis not present

## 2018-07-18 DIAGNOSIS — I25110 Atherosclerotic heart disease of native coronary artery with unstable angina pectoris: Secondary | ICD-10-CM | POA: Diagnosis not present

## 2018-07-18 DIAGNOSIS — G14 Postpolio syndrome: Secondary | ICD-10-CM | POA: Diagnosis not present

## 2018-07-18 DIAGNOSIS — E782 Mixed hyperlipidemia: Secondary | ICD-10-CM | POA: Diagnosis not present

## 2018-07-18 DIAGNOSIS — Z23 Encounter for immunization: Secondary | ICD-10-CM | POA: Diagnosis not present

## 2018-07-18 DIAGNOSIS — Z7984 Long term (current) use of oral hypoglycemic drugs: Secondary | ICD-10-CM | POA: Diagnosis not present

## 2018-09-29 DIAGNOSIS — H903 Sensorineural hearing loss, bilateral: Secondary | ICD-10-CM | POA: Diagnosis not present

## 2018-10-18 DIAGNOSIS — H903 Sensorineural hearing loss, bilateral: Secondary | ICD-10-CM | POA: Diagnosis not present

## 2018-11-08 ENCOUNTER — Ambulatory Visit (INDEPENDENT_AMBULATORY_CARE_PROVIDER_SITE_OTHER): Payer: PPO | Admitting: Cardiology

## 2018-11-08 ENCOUNTER — Encounter: Payer: Self-pay | Admitting: Cardiology

## 2018-11-08 VITALS — BP 130/74 | HR 67 | Ht 70.0 in | Wt 170.8 lb

## 2018-11-08 DIAGNOSIS — I10 Essential (primary) hypertension: Secondary | ICD-10-CM

## 2018-11-08 DIAGNOSIS — E785 Hyperlipidemia, unspecified: Secondary | ICD-10-CM | POA: Diagnosis not present

## 2018-11-08 DIAGNOSIS — R0602 Shortness of breath: Secondary | ICD-10-CM | POA: Diagnosis not present

## 2018-11-08 DIAGNOSIS — E119 Type 2 diabetes mellitus without complications: Secondary | ICD-10-CM | POA: Diagnosis not present

## 2018-11-08 DIAGNOSIS — I25118 Atherosclerotic heart disease of native coronary artery with other forms of angina pectoris: Secondary | ICD-10-CM | POA: Diagnosis not present

## 2018-11-08 DIAGNOSIS — R079 Chest pain, unspecified: Secondary | ICD-10-CM

## 2018-11-08 MED ORDER — NITROGLYCERIN 0.4 MG SL SUBL: 0.4000 mg | SUBLINGUAL_TABLET | SUBLINGUAL | 11 refills | Status: DC | PRN

## 2018-11-08 NOTE — Patient Instructions (Addendum)
Medication Instructions:  Your physician recommends that you continue on your current medications as directed. Please refer to the Current Medication list given to you today.  If you need a refill on your cardiac medications before your next appointment, please call your pharmacy.   Lab work: None  If you have labs (blood work) drawn today and your tests are completely normal, you will receive your results only by: Marland Kitchen MyChart Message (if you have MyChart) OR . A paper copy in the mail If you have any lab test that is abnormal or we need to change your treatment, we will call you to review the results.  Testing/Procedures: You had an EKG today.   Your physician has requested that you have a stress echocardiogram. For further information please visit [email or web address] Please follow instruction sheet as given.  Follow-Up: At [facility], you and your health needs are our priority.  As part of our continuing mission to provide you with exceptional heart care, we have created designated Provider Care Teams.  These Care Teams include your primary Cardiologist (physician) and Advanced Practice Providers (APPs -  Physician Assistants and Nurse Practitioners) who all work together to provide you with the care you need, when you need it. You will need a follow up appointment in 1 years.  Please call our office 2 months in advance to schedule this appointment.     Exercise Stress Test An exercise stress test is a test to check how your heart works during exercise. You will need to walk on a treadmill or ride an exercise bike for this test. An electrocardiogram (ECG) will record your heartbeat when you are at rest and when you are exercising. You may have an ultrasound or nuclear test after the exercise test. The test is done to check for coronary artery disease (CAD). It is also done to:  See how well you can exercise.  Watch for high blood pressure during exercise.  Test how well you can  exercise after treatment.  Check the blood flow to your arms and legs. If your test result is not normal, more testing may be needed. What happens before the procedure?  Follow instructions from your doctor about what you cannot eat or drink. ? Do not have any drinks or foods that have caffeine in them for 24 hours before the test, or as told by your doctor. This includes coffee, tea (even decaf tea), sodas, chocolate, and cocoa.  Ask your doctor about changing or stopping your normal medicines. This is important if you: ? Take diabetes medicines. ? Take beta-blocker medicines. ? Wear a nitroglycerin patch.  If you use an inhaler, bring it with you to the test.  Do not put lotions, powders, creams, or oils on your chest before the test.  Wear comfortable shoes and clothing.  Do not use any products that have nicotine or tobacco in them, such as cigarettes and e-cigarettes. Stop using them at least 4 hours before the test. If you need help quitting, ask your doctor. What happens during the procedure?  Patches (electrodes) will be put on your chest.  Wires will be connected to the patches. The wires will send signals to a machine to record your heartbeat.  Your heart rate will be watched while you are resting and while you are exercising. Your blood pressure will also be watched during the test.  You will walk on a treadmill or use a stationary bike. If you cannot use these, you may be asked to  turn a crank with your hands.  The activity will get harder and will raise your heart rate.  You may be asked to breathe into a tube a few times during the test. This measures the gases that you breathe out.  You will be asked how you are feeling throughout the test.  You will exercise until your heart reaches a target heart rate. You will stop early if: ? You feel dizzy. ? You have chest pain. ? You are out of breath. ? Your blood pressure is too high or too low. ? You have an  irregular heartbeat. ? You have pain or aching in your arms or legs. The procedure may vary among doctors and hospitals. What happens after the procedure?  Your blood pressure, heart rate, breathing rate, and blood oxygen level will be watched after the test.  You may return to your normal diet and activities as told by your doctor.  It is up to you to get the results of your test. Ask your doctor, or the department that is doing the test, when your results will be ready. Summary  An exercise stress test is a test to check how your heart works during exercise.  This test is done to check for coronary artery disease.  Your heart rate will be watched while you are resting and while you are exercising.  Follow instructions from your doctor about what you cannot eat or drink before the test. This information is not intended to replace advice given to you by your health care provider. Make sure you discuss any questions you have with your health care provider. Document Released: 02/03/2008 Document Revised: 11/17/2016 Document Reviewed: 11/17/2016 Elsevier Interactive Patient Education  2019 [employer].

## 2018-11-08 NOTE — Progress Notes (Signed)
Cardiology Office Note:    Date:  11/08/2018   ID:  Edward Russo, DOB Dec 27, 1945, MRN [id number]  PCP:  [name], MD  Cardiologist:  Norman Herrlich, MD    Referring MD: [name], MD    ASSESSMENT:    1. Coronary artery disease of native artery of native heart with stable angina pectoris (HCC)   2. Essential hypertension   3. Hyperlipidemia LDL goal <100   4. Type 2 diabetes mellitus without complication, without long-term current use of insulin (HCC)    PLAN:    In order of problems listed above:  1. CAD is stable continue medical treatment I am unsure if his symptoms are pulmonary or cardiac we discussed an approach of either watchful waiting or noninvasive testing after discussion of the stress echo performed if he had high risk markers we could consider further revascularization or intensification of medical treatment. 2. Blood pressure stable at target continue current treatment 3. Lipids are ideal continue his high intensity statin 4. Stable A1c is at target   Next appointment: 6 weeks   Medication Adjustments/Labs and Tests Ordered: Current medicines are reviewed at length with the patient today.  Concerns regarding medicines are outlined above.  No orders of the defined types were placed in this encounter.  No orders of the defined types were placed in this encounter.   No chief complaint on file.   History of Present Illness:    Edward Russo is a 73 y.o. 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 Compliance with diet, lifestyle and medications: Yes overall is doing well he has a pattern of exertional shortness of breath and has been today walking in from the parking become predictable at times there is some discomfort in his chest but not quite angina.  Because of this he questions if he is having recurrent ischemia and after review of options we decided to do a noninvasive  stress echo for risk stratification he has a prescription for nitroglycerin I asked him to use as needed and for now we will continue treatment including aspirin beta-blocker and statin.  Recent labs at his PCP office 07/18/2018 show cholesterol 115 LDL 62 A1c 7.1 creatinine normal.  He does have COPD but he has no cough or wheezing.  He is now 24 years from bypass surgery Past Medical History:  Diagnosis Date  . Aortic ectasia, thoracic (HCC) 06/12/2015  . COPD (chronic obstructive pulmonary disease) (HCC) 10/04/2017  . Coronary artery disease involving native coronary artery of native heart 06/12/2015  . Esophageal reflux 10/04/2017  . Essential hypertension 06/12/2015  . Hyperlipidemia LDL goal <100 06/12/2015  . Hypertension   . Polio   . Type 2 diabetes mellitus (HCC) 10/04/2017    Past Surgical History:  Procedure Laterality Date  . BACK SURGERY    . CARDIAC CATHETERIZATION  2011   patent LTA and RTA at cath in 2011   . CARDIAC SURGERY    . CORONARY ARTERY BYPASS GRAFT  1996    Current Medications: Current Meds  Medication Sig  . aspirin (GOODSENSE ASPIRIN) 325 MG tablet Take 325 mg by mouth daily.  Marland Kitchen atorvastatin (LIPITOR) 40 MG tablet Take 40 mg by mouth daily.  . carvedilol (COREG) 3.125 MG tablet Take 1 tablet by mouth 2 (two) times daily.   . metFORMIN (GLUCOPHAGE) 850 MG tablet Take 1 tablet by mouth daily.   Marland Kitchen  nitroGLYCERIN (NITROSTAT) 0.4 MG SL tablet Place 1 tablet (0.4 mg total) under the tongue every 5 (five) minutes as needed for chest pain.  Marland Kitchen omeprazole (PRILOSEC) 20 MG capsule Take 20 mg by mouth daily.  . tamsulosin (FLOMAX) 0.4 MG CAPS capsule Take 0.4 mg by mouth daily.      Allergies:   Patient has no known allergies.   Social History   Socioeconomic History  . Marital status: Single    Spouse name: Not on file  . Number of children: Not on file  . Years of education: Not on file  . Highest education level: Not on file  Occupational History  . Not on  file  Social Needs  . Financial resource strain: Not on file  . Food insecurity:    Worry: Not on file    Inability: Not on file  . Transportation needs:    Medical: Not on file    Non-medical: Not on file  Tobacco Use  . Smoking status: Never Smoker  . Smokeless tobacco: Never Used  Substance and Sexual Activity  . Alcohol use: No  . Drug use: No  . Sexual activity: Not on file  Lifestyle  . Physical activity:    Days per week: Not on file    Minutes per session: Not on file  . Stress: Not on file  Relationships  . Social connections:    Talks on phone: Not on file    Gets together: Not on file    Attends religious service: Not on file    Active member of club or organization: Not on file    Attends meetings of clubs or organizations: Not on file    Relationship status: Not on file  Other Topics Concern  . Not on file  Social History Narrative  . Not on file     Family History: The patient's family history includes Asthma in his mother; CAD in his father; Heart attack in his brother; Heart failure in his mother. ROS:   Please see the history of present illness.    All other systems reviewed and are negative.  EKGs/Labs/Other Studies Reviewed:    The following studies were reviewed today:  EKG:  EKG ordered today and personally reviewed.  The ekg ordered today demonstrates [facility] normal EKG  Recent Labs: No results found for requested labs within last 8760 hours.  Recent Lipid Panel    Component Value Date/Time   CHOL (H) 10/27/2009 0012    241        ATP III CLASSIFICATION:  <200     mg/dL   Desirable  015-615  mg/dL   Borderline High  >=379    mg/dL   High          TRIG 432 (H) 10/27/2009 0012   HDL 30 (L) 10/27/2009 0012   CHOLHDL 8.0 10/27/2009 0012   VLDL 36 10/27/2009 0012   LDLCALC (H) 10/27/2009 0012    175        Total Cholesterol/HDL:CHD Risk Coronary Heart Disease Risk Table                     Men   Women  1/2 Average Risk   3.4   3.3   Average Risk       5.0   4.4  2 X Average Risk   9.6   7.1  3 X Average Risk  23.4   11.0        Use the  calculated Patient Ratio above and the CHD Risk Table to determine the patient's CHD Risk.        ATP III CLASSIFICATION (LDL):  <100     mg/dL   Optimal  161-096100-129  mg/dL   Near or Above                    Optimal  130-159  mg/dL   Borderline  045-409160-189  mg/dL   High  >811>190     mg/dL   Very High    Physical Exam:    VS:  BP 130/74 (BP Location: Left Arm, Patient Position: Sitting, Cuff Size: Normal)   Pulse 67   Ht 5\' 10"  (1.778 m)   Wt 170 lb 12.8 oz (77.5 kg)   SpO2 98%   BMI 24.51 kg/m     Wt Readings from Last 3 Encounters:  11/08/18 170 lb 12.8 oz (77.5 kg)  10/28/17 165 lb (74.8 kg)     GEN:  Well nourished, well developed in no acute distress HEENT: Normal NECK: No JVD; No carotid bruits LYMPHATICS: No lymphadenopathy CARDIAC: RRR, no murmurs, rubs, gallops RESPIRATORY:  Clear to auscultation without rales, wheezing or rhonchi  ABDOMEN: Soft, non-tender, non-distended MUSCULOSKELETAL:  No edema; No deformity  SKIN: Warm and dry NEUROLOGIC:  Alert and oriented x 3 PSYCHIATRIC:  Normal affect    Signed, Norman HerrlichBrian Shavonne Ambroise, MD  11/08/2018 3:07 PM    [facility]

## 2018-11-28 ENCOUNTER — Telehealth: Payer: Self-pay | Admitting: *Deleted

## 2018-11-28 NOTE — Telephone Encounter (Signed)
Nuclear appt cancelled

## 2018-12-02 ENCOUNTER — Other Ambulatory Visit: Payer: PPO

## 2019-01-25 DIAGNOSIS — I119 Hypertensive heart disease without heart failure: Secondary | ICD-10-CM | POA: Diagnosis not present

## 2019-01-25 DIAGNOSIS — E782 Mixed hyperlipidemia: Secondary | ICD-10-CM | POA: Diagnosis not present

## 2019-01-25 DIAGNOSIS — Z125 Encounter for screening for malignant neoplasm of prostate: Secondary | ICD-10-CM | POA: Diagnosis not present

## 2019-01-25 DIAGNOSIS — E1169 Type 2 diabetes mellitus with other specified complication: Secondary | ICD-10-CM | POA: Diagnosis not present

## 2019-01-25 DIAGNOSIS — Z0000 Encounter for general adult medical examination without abnormal findings: Secondary | ICD-10-CM | POA: Diagnosis not present

## 2019-01-25 DIAGNOSIS — G14 Postpolio syndrome: Secondary | ICD-10-CM | POA: Diagnosis not present

## 2019-01-25 DIAGNOSIS — J449 Chronic obstructive pulmonary disease, unspecified: Secondary | ICD-10-CM | POA: Diagnosis not present

## 2019-01-25 DIAGNOSIS — I25110 Atherosclerotic heart disease of native coronary artery with unstable angina pectoris: Secondary | ICD-10-CM | POA: Diagnosis not present

## 2019-01-25 DIAGNOSIS — N400 Enlarged prostate without lower urinary tract symptoms: Secondary | ICD-10-CM | POA: Diagnosis not present

## 2019-04-25 ENCOUNTER — Telehealth: Payer: Self-pay | Admitting: Cardiology

## 2019-04-25 DIAGNOSIS — I2510 Atherosclerotic heart disease of native coronary artery without angina pectoris: Secondary | ICD-10-CM

## 2019-04-25 NOTE — Telephone Encounter (Signed)
Patient was scheduled for stress echo and would like to go ahead and possibly have a Myoview.. Is that ok?

## 2019-04-26 NOTE — Telephone Encounter (Signed)
Left message for patient to return call to find out if he wants to schedule the lexiscan at the [facility] or [facility] location.

## 2019-04-26 NOTE — Telephone Encounter (Signed)
Good idea I do not think working to be doing treadmill stress test in the near future in this office she need to have COVID-19 testing done point of service Seletz schedule him up for a [employer]

## 2019-04-26 NOTE — Telephone Encounter (Signed)
A stress echocardiogram was ordered during patient's last office visit on 11/08/2018 and has not been completed due to COVID-19. Please advise if patient can have a lexiscan arranged now instead. Thanks!

## 2019-04-27 ENCOUNTER — Encounter: Payer: Self-pay | Admitting: *Deleted

## 2019-04-27 ENCOUNTER — Telehealth: Payer: Self-pay | Admitting: Cardiology

## 2019-04-27 NOTE — Telephone Encounter (Signed)
Patient will be willing to go to [facility] to have test.

## 2019-04-27 NOTE — Addendum Note (Signed)
Addended by: Austin Miles on: 04/27/2019 01:57 PM   Modules accepted: Orders

## 2019-04-27 NOTE — Telephone Encounter (Signed)
Returned your call.

## 2019-04-27 NOTE — Telephone Encounter (Signed)
Duplicate encounter. Please see previous encounter.  

## 2019-04-27 NOTE — Telephone Encounter (Signed)
Patient informed that his lexiscan has been scheduled for 05/05/2019 at 7:45 am at the [facility] office. Address and instructions provided. Patient verbalized understanding. Copy of instructions mailed as requested. No further questions.

## 2019-05-03 ENCOUNTER — Telehealth (HOSPITAL_COMMUNITY): Payer: Self-pay

## 2019-05-03 NOTE — Telephone Encounter (Signed)
Pt was not available to review instructions at time that his wife called. I can not ensure that wife is clear and understanding of these instructions enough to relay to patient accordingly. I will call and reach out to patient himself at his personal number.   Pt called. Detailed instructions given. Pt did RBV same.

## 2019-05-05 ENCOUNTER — Ambulatory Visit (HOSPITAL_COMMUNITY)
Admission: RE | Admit: 2019-05-05 | Discharge: 2019-05-05 | Disposition: A | Discharge status: Home / Self Care | Payer: PPO | Source: Ambulatory Visit | Attending: Cardiology | Admitting: Cardiology

## 2019-05-05 ENCOUNTER — Other Ambulatory Visit: Payer: Self-pay

## 2019-05-05 DIAGNOSIS — I2510 Atherosclerotic heart disease of native coronary artery without angina pectoris: Secondary | ICD-10-CM

## 2019-05-05 LAB — MYOCARDIAL PERFUSION IMAGING
LV dias vol: 96 mL (ref 62–150)
LV sys vol: 39 mL
Peak HR: 80 {beats}/min
Rest HR: 54 {beats}/min
SDS: 5
SRS: 1
SSS: 6
TID: 1.13

## 2019-05-05 MED ORDER — TECHNETIUM TC 99M TETROFOSMIN IV KIT
31.1000 | PACK | Freq: Once | INTRAVENOUS | Status: AC | PRN
  Administered 2019-05-05: 31.1 via INTRAVENOUS
  Filled 2019-05-05: qty 32

## 2019-05-05 MED ORDER — REGADENOSON 0.4 MG/5ML IV SOLN
0.4000 mg | Freq: Once | INTRAVENOUS | Status: AC
  Administered 2019-05-05: 0.4 mg via INTRAVENOUS

## 2019-05-05 MED ORDER — TECHNETIUM TC 99M TETROFOSMIN IV KIT
10.5000 | PACK | Freq: Once | INTRAVENOUS | Status: AC | PRN
  Administered 2019-05-05: 10.5 via INTRAVENOUS
  Filled 2019-05-05: qty 11

## 2019-05-07 NOTE — Progress Notes (Signed)
Cardiology Office Note:    Date:  05/09/2019   ID:  CAGE Edward Russo, DOB [birth year], MRN [id number]  PCP:  Mayra Neer, MD  Cardiologist:  Shirlee More, MD    Referring MD: Mayra Neer, MD    ASSESSMENT:    1. Coronary artery disease involving native coronary artery of native heart, angina presence unspecified   2. Hyperlipidemia LDL goal <100   3. Essential hypertension   4. SOB (shortness of breath) on exertion    PLAN:    In order of problems listed above:  1. He is having vague symptoms remote bypass abnormal myocardial perfusion study with peri-infarct ischemia.  Require coronary angiography but I think the clinical problem today is heart failure with edema shortness of breath we will check proBNP level make a decision about a diuretic and see back in the office to assess response.  If he continues to be symptomatic her proBNP is normal I favor coronary angiography and he agrees.  Continue his current medical treatment including aspirin beta-blocker high intensity statin 2. Stable continue statin lipids at target 3. Controlled continue treatment beta-blocker 4. He appears to have heart failure with edema shortness of dry breath check proBNP if elevated start on low-dose diuretic if normal consider the merits of coronary angiography   Next appointment: Next   Medication Adjustments/Labs and Tests Ordered: Current medicines are reviewed at length with the patient today.  Concerns regarding medicines are outlined above.  No orders of the defined types were placed in this encounter.  No orders of the defined types were placed in this encounter.   No chief complaint on file.   History of Present Illness:    Edward Russo is a 73 y.o. 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 Compliance with diet, lifestyle and medications: Yes  He is not doing as well he has more fatigue and  shortness of breath with activities like walking in from the car walking the dog a couple 100 foot he is unaware he had edema but no orthopnea cough or palpitation.  He is also had some vague nonexertional brief sternal chest pain not severe not requiring rest for relief he has had a couple of episodes in the last 6 months.  In short he does not feel as well as he did before.  He looked at the results of his myocardial perfusion study and is surprised to find that he has evidence of preceding MI he did note that prior to today I reviewed with him that it is not a high risk study by itself I would be inclined to bring him to coronary angiography but I am concerned with his symptoms on physical exam he is 1-2+ edema we will check a proBNP level and bring back in the office in a few weeks to pull things together.  His EKG shows sinus rhythm with APCs  MPI 09/04/20202: Study Highlights   The left ventricular ejection fraction is normal (55-65%).  Nuclear stress EF: 59%. No wall motion abnormalities  There was no ST segment deviation noted during stress.  Defect 1: There is a medium defect of moderate severity present in the basal inferolateral and mid inferolateral location.  Findings consistent with prior myocardial infarction with peri-infarct ischemia in the base to mid inferolateral wall segment.  This is an intermediate risk study.  Prior CABG    Past Medical History:  Diagnosis Date  . Aortic ectasia, thoracic (HCC) 06/12/2015  . COPD (chronic obstructive pulmonary disease) (HCC) 10/04/2017  . Coronary artery disease involving native coronary artery of native heart 06/12/2015  . Esophageal reflux 10/04/2017  . Essential hypertension 06/12/2015  . Hyperlipidemia LDL goal <100 06/12/2015  . Hypertension   . Polio   . Type 2 diabetes mellitus (HCC) 10/04/2017    Past Surgical History:  Procedure Laterality Date  . BACK SURGERY    . CARDIAC CATHETERIZATION  2011   patent LTA and RTA at  cath in 2011   . CARDIAC SURGERY    . CORONARY ARTERY BYPASS GRAFT  1996    Current Medications: Current Meds  Medication Sig  . aspirin (GOODSENSE ASPIRIN) 325 MG tablet Take 325 mg by mouth daily.  Marland Kitchen. atorvastatin (LIPITOR) 40 MG tablet Take 40 mg by mouth daily.  . carvedilol (COREG) 3.125 MG tablet Take 1 tablet by mouth 2 (two) times daily.   . metFORMIN (GLUCOPHAGE) 850 MG tablet Take 1 tablet by mouth daily.   . nitroGLYCERIN (NITROSTAT) 0.4 MG SL tablet Place 1 tablet (0.4 mg total) under the tongue every 5 (five) minutes as needed for chest pain.  Marland Kitchen. omeprazole (PRILOSEC) 20 MG capsule Take 20 mg by mouth daily.  . tamsulosin (FLOMAX) 0.4 MG CAPS capsule Take 0.4 mg by mouth daily.      Allergies:   Patient has no known allergies.   Social History   Socioeconomic History  . Marital status: Single    Spouse name: Not on file  . Number of children: Not on file  . Years of education: Not on file  . Highest education level: Not on file  Occupational History  . Not on file  Social Needs  . Financial resource strain: Not on file  . Food insecurity    Worry: Not on file    Inability: Not on file  . Transportation needs    Medical: Not on file    Non-medical: Not on file  Tobacco Use  . Smoking status: Never Smoker  . Smokeless tobacco: Never Used  Substance and Sexual Activity  . Alcohol use: No  . Drug use: No  . Sexual activity: Not on file  Lifestyle  . Physical activity    Days per week: Not on file    Minutes per session: Not on file  . Stress: Not on file  Relationships  . Social Musicianconnections    Talks on phone: Not on file    Gets together: Not on file    Attends religious service: Not on file    Active member of club or organization: Not on file    Attends meetings of clubs or organizations: Not on file    Relationship status: Not on file  Other Topics Concern  . Not on file  Social History Narrative  . Not on file     Family History: The  patient's family history includes Asthma in his mother; CAD in his father; Heart attack in his brother; Heart failure in his mother. ROS:   Please see the history of present illness.    All other systems reviewed and are negative.  EKGs/Labs/Other Studies Reviewed:    The following studies were reviewed today:  EKG:  EKG ordered today and personally reviewed.  The ekg ordered today demonstrates rhythm APCs  Recent Labs: 07/18/2018 cholesterol 115 HDL 35 LDL 62 hemoglobin 14.2 creatinine 0.86 potassium 4.3 Recent Lipid Panel    Component Value Date/Time  CHOL (H) 10/27/2009 0012    241        ATP III CLASSIFICATION:  <200     mg/dL   Desirable  591-638  mg/dL   Borderline High  >=466    mg/dL   High          TRIG 599 (H) 10/27/2009 0012   HDL 30 (L) 10/27/2009 0012   CHOLHDL 8.0 10/27/2009 0012   VLDL 36 10/27/2009 0012   LDLCALC (H) 10/27/2009 0012    175        Total Cholesterol/HDL:CHD Risk Coronary Heart Disease Risk Table                     Men   Women  1/2 Average Risk   3.4   3.3  Average Risk       5.0   4.4  2 X Average Risk   9.6   7.1  3 X Average Risk  23.4   11.0        Use the calculated Patient Ratio above and the CHD Risk Table to determine the patient's CHD Risk.        ATP III CLASSIFICATION (LDL):  <100     mg/dL   Optimal  357-017  mg/dL   Near or Above                    Optimal  130-159  mg/dL   Borderline  793-903  mg/dL   High  >009     mg/dL   Very High    Physical Exam:    VS:  BP (!) 164/86 (BP Location: Right Arm, Patient Position: Sitting, Cuff Size: Normal)   Pulse (!) 56   Temp (!) 97.5 F (36.4 C)   Ht 5\' 10"  (1.778 m)   Wt 163 lb 1.9 oz (74 kg)   SpO2 97%   BMI 23.41 kg/m     Wt Readings from Last 3 Encounters:  05/09/19 163 lb 1.9 oz (74 kg)  05/05/19 170 lb (77.1 kg)  11/08/18 170 lb 12.8 oz (77.5 kg)     GEN:  Well nourished, well developed in no acute distress HEENT: Normal NECK: No JVD; No carotid bruits  LYMPHATICS: No lymphadenopathy CARDIAC: \RRR, no murmurs, rubs, gallops RESPIRATORY:  Clear to auscultation without rales, wheezing or rhonchi  ABDOMEN: Soft, non-tender, non-distended MUSCULOSKELETAL: 2+ bilateral pitting edema; No deformity  SKIN: Warm and dry NEUROLOGIC:  Alert and oriented x 3 PSYCHIATRIC:  Normal affect    Signed, Norman Herrlich, MD  05/09/2019 9:40 AM    [facility]

## 2019-05-09 ENCOUNTER — Ambulatory Visit (INDEPENDENT_AMBULATORY_CARE_PROVIDER_SITE_OTHER): Payer: PPO | Admitting: Cardiology

## 2019-05-09 ENCOUNTER — Other Ambulatory Visit: Payer: Self-pay

## 2019-05-09 ENCOUNTER — Encounter: Payer: Self-pay | Admitting: Cardiology

## 2019-05-09 VITALS — BP 164/86 | HR 56 | Temp 97.5°F | Ht 70.0 in | Wt 163.1 lb

## 2019-05-09 DIAGNOSIS — R0602 Shortness of breath: Secondary | ICD-10-CM

## 2019-05-09 DIAGNOSIS — I2510 Atherosclerotic heart disease of native coronary artery without angina pectoris: Secondary | ICD-10-CM | POA: Diagnosis not present

## 2019-05-09 DIAGNOSIS — I10 Essential (primary) hypertension: Secondary | ICD-10-CM

## 2019-05-09 DIAGNOSIS — E785 Hyperlipidemia, unspecified: Secondary | ICD-10-CM | POA: Diagnosis not present

## 2019-05-09 NOTE — Patient Instructions (Signed)
Medication Instructions:  Your physician recommends that you continue on your current medications as directed. Please refer to the Current Medication list given to you today.  If you need a refill on your cardiac medications before your next appointment, please call your pharmacy.   Lab work: Your physician recommends that you return for lab work today: lipid panel, CMP, proBNP.   If you have labs (blood work) drawn today and your tests are completely normal, you will receive your results only by: Marland Kitchen MyChart Message (if you have MyChart) OR . A paper copy in the mail If you have any lab test that is abnormal or we need to change your treatment, we will call you to review the results.  Testing/Procedures: You had an EKG today.   Follow-Up: At [facility], you and your health needs are our priority.  As part of our continuing mission to provide you with exceptional heart care, we have created designated Provider Care Teams.  These Care Teams include your primary Cardiologist (physician) and Advanced Practice Providers (APPs -  Physician Assistants and Nurse Practitioners) who all work together to provide you with the care you need, when you need it. You will need a follow up appointment in 3 weeks.

## 2019-05-10 LAB — LIPID PANEL
Chol/HDL Ratio: 3.2 ratio (ref 0.0–5.0)
Cholesterol, Total: 109 mg/dL (ref 100–199)
HDL: 34 mg/dL — ABNORMAL LOW (ref >39)
LDL Chol Calc (NIH): 57 mg/dL (ref 0–99)
Triglycerides: 94 mg/dL (ref 0–149)
VLDL Cholesterol Cal: 18 mg/dL (ref 5–40)

## 2019-05-10 LAB — COMPREHENSIVE METABOLIC PANEL
ALT: 18 IU/L (ref 0–44)
AST: 15 IU/L (ref 0–40)
Albumin/Globulin Ratio: 2 (ref 1.2–2.2)
Albumin: 4.4 g/dL (ref 3.7–4.7)
Alkaline Phosphatase: 56 IU/L (ref 39–117)
BUN/Creatinine Ratio: 16 (ref 10–24)
BUN: 14 mg/dL (ref 8–27)
Bilirubin Total: 0.9 mg/dL (ref 0.0–1.2)
CO2: 19 mmol/L — ABNORMAL LOW (ref 20–29)
Calcium: 8.7 mg/dL (ref 8.6–10.2)
Chloride: 108 mmol/L — ABNORMAL HIGH (ref 96–106)
Creatinine, Ser: 0.88 mg/dL (ref 0.76–1.27)
GFR calc Af Amer: 99 mL/min/{1.73_m2} (ref >59)
GFR calc non Af Amer: 86 mL/min/{1.73_m2} (ref >59)
Globulin, Total: 2.2 g/dL (ref 1.5–4.5)
Glucose: 109 mg/dL — ABNORMAL HIGH (ref 65–99)
Potassium: 4.3 mmol/L (ref 3.5–5.2)
Sodium: 144 mmol/L (ref 134–144)
Total Protein: 6.6 g/dL (ref 6.0–8.5)

## 2019-05-10 LAB — PRO B NATRIURETIC PEPTIDE: NT-Pro BNP: 194 pg/mL (ref 0–376)

## 2019-05-11 ENCOUNTER — Telehealth: Payer: Self-pay | Admitting: Cardiology

## 2019-05-11 NOTE — Telephone Encounter (Signed)
Patient had questions regarding setting up left heart catheterization.  Patient's questions were answered to the best of my ability, and patient was advised to write down any further questions he may think of between now and his appointment.  He and Dr Bettina Gavia can adddress those questions at follow up.  Patient agreed to plan and verbalized understanding. No further questions.

## 2019-05-11 NOTE — Telephone Encounter (Signed)
Patient has questions about his upcoming procedure.

## 2019-05-30 ENCOUNTER — Ambulatory Visit: Payer: PPO | Admitting: Cardiology

## 2019-05-31 ENCOUNTER — Ambulatory Visit: Payer: PPO | Admitting: Cardiology

## 2019-06-01 ENCOUNTER — Other Ambulatory Visit: Payer: Self-pay

## 2019-06-01 ENCOUNTER — Ambulatory Visit (INDEPENDENT_AMBULATORY_CARE_PROVIDER_SITE_OTHER): Payer: PPO | Admitting: Cardiology

## 2019-06-01 VITALS — BP 158/92 | HR 52 | Temp 97.7°F | Ht 70.0 in | Wt 162.1 lb

## 2019-06-01 DIAGNOSIS — I10 Essential (primary) hypertension: Secondary | ICD-10-CM

## 2019-06-01 DIAGNOSIS — E785 Hyperlipidemia, unspecified: Secondary | ICD-10-CM

## 2019-06-01 DIAGNOSIS — R0602 Shortness of breath: Secondary | ICD-10-CM | POA: Diagnosis not present

## 2019-06-01 DIAGNOSIS — E119 Type 2 diabetes mellitus without complications: Secondary | ICD-10-CM | POA: Diagnosis not present

## 2019-06-01 DIAGNOSIS — I2510 Atherosclerotic heart disease of native coronary artery without angina pectoris: Secondary | ICD-10-CM | POA: Diagnosis not present

## 2019-06-01 MED ORDER — ISOSORBIDE MONONITRATE ER 30 MG PO TB24: 30.0000 mg | ORAL_TABLET | Freq: Every day | ORAL | 3 refills | Status: DC

## 2019-06-01 NOTE — Progress Notes (Signed)
Cardiology Office Note:    Date:  06/01/2019   ID:  Edward Russo, DOB 15-Jun-1946, MRN [id number]  PCP:  Edward Neer, MD  Cardiologist:  Edward More, MD    Referring MD: Edward Neer, MD    ASSESSMENT:    1. Coronary artery disease involving native coronary artery of native heart, angina presence unspecified   2. SOB (shortness of breath) on exertion   3. Essential hypertension   4. Hyperlipidemia LDL goal <100   5. Type 2 diabetes mellitus without complication, without long-term current use of insulin (HCC)    PLAN:    In order of problems listed above:  Clinically his shortness of breath with activity is anginal equivalent.  As opposed to revascularization he wants to intensify medical treatment lateral mononitrate and follow symptoms.  If he is unhappy with the quality of his life will consider heart catheterization looking for opportunities for percutaneous intervention.  The extent of ischemia was mild on his myocardial perfusion study and clinically he is stable.  He will continue antiplatelet lipid-lowering and beta-blocker therapy. Hypertension stable continue treatment beta-blocker Hyperlipidemia stable continue statin Diabetes stable continue Metformin, if additional medication is needed GLP-1 agonist or SGLT2 antagonist would be appropriate from a cardiac perspective perspective   Next appointment: 6 months   Medication Adjustments/Labs and Tests Ordered: Current medicines are reviewed at length with the patient today.  Concerns regarding medicines are outlined above.  No orders of the defined types were placed in this encounter.  No orders of the defined types were placed in this encounter.   Chief Complaint  Patient presents with  . Follow-up  . Coronary Artery Disease    with abnormal MPI    History of Present Illness:    Edward Russo is a 73 y.o. male with a hx of CAD, Dyslipidemia, HTN,COPD  S/P CABG in 1996 and patent grafts, LTA and RTA, in  2011  last seen 05/09/2019 with exertional SOB and an abnormal MPI. Compliance with diet, lifestyle and medications: Yes  He is seen here today along with his wife to discuss the potential of coronary angiography and further revascularization.  With a change in the weather he feels better he is not having exertional dyspnea nearly as frequently wants to intensify medical therapy and avoid further interventions if possible.  I told him I think that is a reasonable approach will start oral mononitrates and follow symptoms.  Also complains of fatigue in the evenings but drives back and forth to work and works a full job at his age I told him he might want to consider cutting back.  His diabetes, hypertension hyperlipidemia are stable on current medical treatment.  I spent 20 minutes in the room with the patient face-to-face and his wife reviewing his myocardial perfusion study options for treatment and participating in shared decision making.  MPI 09/04/20202: Study Highlights   The left ventricular ejection fraction is normal (55-65%).  Nuclear stress EF: 59%. No wall motion abnormalities  There was no ST segment deviation noted during stress.  Defect 1: There is a medium defect of moderate severity present in the basal inferolateral and mid inferolateral location.  Findings consistent with prior myocardial infarction with peri-infarct ischemia in the base to mid inferolateral wall segment.  This is an intermediate risk study.  Prior CABG    Ref Range & Units 3wk ago  NT-Pro BNP 0 - 376 pg/mL 194     Past Medical History:  Diagnosis Date  .  Aortic ectasia, thoracic (HCC) 06/12/2015  . COPD (chronic obstructive pulmonary disease) (HCC) 10/04/2017  . Coronary artery disease involving native coronary artery of native heart 06/12/2015  . Esophageal reflux 10/04/2017  . Essential hypertension 06/12/2015  . Hyperlipidemia LDL goal <100 06/12/2015  . Hypertension   . Polio   . Type 2  diabetes mellitus (HCC) 10/04/2017    Past Surgical History:  Procedure Laterality Date  . BACK SURGERY    . CARDIAC CATHETERIZATION  2011   patent LTA and RTA at cath in 2011   . CARDIAC SURGERY    . CORONARY ARTERY BYPASS GRAFT  1996    Current Medications: Current Meds  Medication Sig  . aspirin (GOODSENSE ASPIRIN) 325 MG tablet Take 325 mg by mouth daily.  Marland Kitchen. atorvastatin (LIPITOR) 40 MG tablet Take 40 mg by mouth daily.  . carvedilol (COREG) 3.125 MG tablet Take 1 tablet by mouth 2 (two) times daily.   . metFORMIN (GLUCOPHAGE) 850 MG tablet Take 1 tablet by mouth daily.   . nitroGLYCERIN (NITROSTAT) 0.4 MG SL tablet Place 1 tablet (0.4 mg total) under the tongue every 5 (five) minutes as needed for chest pain.  Marland Kitchen. omeprazole (PRILOSEC) 20 MG capsule Take 20 mg by mouth daily.  . tamsulosin (FLOMAX) 0.4 MG CAPS capsule Take 0.4 mg by mouth daily.      Allergies:   Patient has no known allergies.   Social History   Socioeconomic History  . Marital status: Married    Spouse name: Not on file  . Number of children: Not on file  . Years of education: Not on file  . Highest education level: Not on file  Occupational History  . Not on file  Social Needs  . Financial resource strain: Not on file  . Food insecurity    Worry: Not on file    Inability: Not on file  . Transportation needs    Medical: Not on file    Non-medical: Not on file  Tobacco Use  . Smoking status: Never Smoker  . Smokeless tobacco: Never Used  Substance and Sexual Activity  . Alcohol use: No  . Drug use: No  . Sexual activity: Not on file  Lifestyle  . Physical activity    Days per week: Not on file    Minutes per session: Not on file  . Stress: Not on file  Relationships  . Social Musicianconnections    Talks on phone: Not on file    Gets together: Not on file    Attends religious service: Not on file    Active member of club or organization: Not on file    Attends meetings of clubs or  organizations: Not on file    Relationship status: Not on file  Other Topics Concern  . Not on file  Social History Narrative  . Not on file     Family History: The patient's family history includes Asthma in his mother; CAD in his father; Heart attack in his brother; Heart failure in his mother. ROS:   Please see the history of present illness.    All other systems reviewed and are negative.  EKGs/Labs/Other Studies Reviewed:    The following studies were reviewed today:   Recent Labs: 05/09/2019: ALT 18; BUN 14; Creatinine, Ser 0.88; NT-Pro BNP 194; Potassium 4.3; Sodium 144  Recent Lipid Panel    Component Value Date/Time   CHOL 109 05/09/2019 0950   TRIG 94 05/09/2019 0950   HDL 34 (L)  05/09/2019 0950   CHOLHDL 3.2 05/09/2019 0950   CHOLHDL 8.0 10/27/2009 0012   VLDL 36 10/27/2009 0012   LDLCALC 57 05/09/2019 0950    Physical Exam:    VS:  BP (!) 158/92 (BP Location: Left Arm, Patient Position: Sitting, Cuff Size: Normal)   Pulse (!) 52   Temp 97.7 F (36.5 C)   Ht 5\' 10"  (1.778 m)   Wt 162 lb 1.9 oz (73.5 kg)   SpO2 97%   BMI 23.26 kg/m     Wt Readings from Last 3 Encounters:  06/01/19 162 lb 1.9 oz (73.5 kg)  05/09/19 163 lb 1.9 oz (74 kg)  05/05/19 170 lb (77.1 kg)     GEN:  Well nourished, well developed in no acute distress HEENT: Normal NECK: No JVD; No carotid bruits LYMPHATICS: No lymphadenopathy CARDIAC: RRR, no murmurs, rubs, gallops RESPIRATORY:  Clear to auscultation without rales, wheezing or rhonchi  ABDOMEN: Soft, non-tender, non-distended MUSCULOSKELETAL:  No edema; No deformity  SKIN: Warm and dry NEUROLOGIC:  Alert and oriented x 3 PSYCHIATRIC:  Normal affect    Signed, 07/05/19, MD  06/01/2019 9:14 AM    [facility]

## 2019-06-01 NOTE — Patient Instructions (Signed)
Medication Instructions:  Your physician has recommended you make the following change in your medication:   START isosorbide mononitrate (imdur) 30 mg: Take 1 tablet daily   If you need a refill on your cardiac medications before your next appointment, please call your pharmacy.   Lab work: None  If you have labs (blood work) drawn today and your tests are completely normal, you will receive your results only by: Edward Russo MyChart Message (if you have MyChart) OR . A paper copy in the mail If you have any lab test that is abnormal or we need to change your treatment, we will call you to review the results.  Testing/Procedures: None  Follow-Up: At [facility], you and your health needs are our priority.  As part of our continuing mission to provide you with exceptional heart care, we have created designated Provider Care Teams.  These Care Teams include your primary Cardiologist (physician) and Advanced Practice Providers (APPs -  Physician Assistants and Nurse Practitioners) who all work together to provide you with the care you need, when you need it. You will need a follow up appointment in 6 months.  Please call our office 2 months in advance to schedule this appointment.      Isosorbide Mononitrate extended-release tablets What is this medicine? ISOSORBIDE MONONITRATE (eye soe SOR bide mon oh NYE trate) is a vasodilator. It relaxes blood vessels, increasing the blood and oxygen supply to your heart. This medicine is used to prevent chest pain caused by angina. It will not help to stop an episode of chest pain. This medicine may be used for other purposes; ask your health care provider or pharmacist if you have questions. COMMON BRAND NAME(S): Imdur, Isotrate ER What should I tell my health care provider before I take this medicine? They need to know if you have any of these conditions:  previous heart attack or heart failure  an unusual or allergic reaction to isosorbide  mononitrate, nitrates, other medicines, foods, dyes, or preservatives  pregnant or trying to get pregnant  breast-feeding How should I use this medicine? Take this medicine by mouth with a glass of water. Follow the directions on the prescription label. Do not crush or chew. Take your medicine at regular intervals. Do not take your medicine more often than directed. Do not stop taking this medicine except on the advice of your doctor or health care professional. Talk to your pediatrician regarding the use of this medicine in children. Special care may be needed. Overdosage: If you think you have taken too much of this medicine contact a poison control center or emergency room at once. NOTE: This medicine is only for you. Do not share this medicine with others. What if I miss a dose? If you miss a dose, take it as soon as you can. If it is almost time for your next dose, take only that dose. Do not take double or extra doses. What may interact with this medicine? Do not take this medicine with any of the following medications:  medicines used to treat erectile dysfunction (ED) like avanafil, sildenafil, tadalafil, and vardenafil  riociguat This medicine may also interact with the following medications:  medicines for high blood pressure  other medicines for angina or heart failure This list may not describe all possible interactions. Give your health care provider a list of all the medicines, herbs, non-prescription drugs, or dietary supplements you use. Also tell them if you smoke, drink alcohol, or use illegal drugs. Some items  may interact with your medicine. What should I watch for while using this medicine? Check your heart rate and blood pressure regularly while you are taking this medicine. Ask your doctor or health care professional what your heart rate and blood pressure should be and when you should contact him or her. Tell your doctor or health care professional if you feel your  medicine is no longer working. You may get dizzy. Do not drive, use machinery, or do anything that needs mental alertness until you know how this medicine affects you. To reduce the risk of dizzy or fainting spells, do not sit or stand up quickly, especially if you are an older patient. Alcohol can make you more dizzy, and increase flushing and rapid heartbeats. Avoid alcoholic drinks. Do not treat yourself for coughs, colds, or pain while you are taking this medicine without asking your doctor or health care professional for advice. Some ingredients may increase your blood pressure. What side effects may I notice from receiving this medicine? Side effects that you should report to your doctor or health care professional as soon as possible:  bluish discoloration of lips, fingernails, or palms of hands  irregular heartbeat, palpitations  low blood pressure  nausea, vomiting  persistent headache  unusually weak or tired Side effects that usually do not require medical attention (report to your doctor or health care professional if they continue or are bothersome):  flushing of the face or neck  rash This list may not describe all possible side effects. Call your doctor for medical advice about side effects. You may report side effects to FDA at 1-800-FDA-1088. Where should I keep my medicine? Keep out of the reach of children. Store between 15 and 30 degrees C (59 and 86 degrees F). Keep container tightly closed. Throw away any unused medicine after the expiration date. NOTE: This sheet is a summary. It may not cover all possible information. If you have questions about this medicine, talk to your doctor, pharmacist, or health care provider.  2020 Elsevier/Gold Standard (2013-06-16 14:48:19)

## 2019-07-26 DIAGNOSIS — I25110 Atherosclerotic heart disease of native coronary artery with unstable angina pectoris: Secondary | ICD-10-CM | POA: Diagnosis not present

## 2019-07-26 DIAGNOSIS — J449 Chronic obstructive pulmonary disease, unspecified: Secondary | ICD-10-CM | POA: Diagnosis not present

## 2019-07-26 DIAGNOSIS — Z7189 Other specified counseling: Secondary | ICD-10-CM | POA: Diagnosis not present

## 2019-07-26 DIAGNOSIS — G14 Postpolio syndrome: Secondary | ICD-10-CM | POA: Diagnosis not present

## 2019-07-26 DIAGNOSIS — E782 Mixed hyperlipidemia: Secondary | ICD-10-CM | POA: Diagnosis not present

## 2019-07-26 DIAGNOSIS — I119 Hypertensive heart disease without heart failure: Secondary | ICD-10-CM | POA: Diagnosis not present

## 2019-07-26 DIAGNOSIS — E1169 Type 2 diabetes mellitus with other specified complication: Secondary | ICD-10-CM | POA: Diagnosis not present

## 2019-07-26 DIAGNOSIS — Z7984 Long term (current) use of oral hypoglycemic drugs: Secondary | ICD-10-CM | POA: Diagnosis not present

## 2019-08-02 DIAGNOSIS — E1169 Type 2 diabetes mellitus with other specified complication: Secondary | ICD-10-CM | POA: Diagnosis not present

## 2019-08-02 DIAGNOSIS — Z23 Encounter for immunization: Secondary | ICD-10-CM | POA: Diagnosis not present

## 2019-08-02 DIAGNOSIS — Z7984 Long term (current) use of oral hypoglycemic drugs: Secondary | ICD-10-CM | POA: Diagnosis not present

## 2019-08-02 DIAGNOSIS — I119 Hypertensive heart disease without heart failure: Secondary | ICD-10-CM | POA: Diagnosis not present

## 2019-08-13 DIAGNOSIS — R300 Dysuria: Secondary | ICD-10-CM | POA: Diagnosis not present

## 2019-08-14 ENCOUNTER — Other Ambulatory Visit: Payer: Self-pay

## 2019-08-14 ENCOUNTER — Encounter: Payer: Self-pay | Admitting: Emergency Medicine

## 2019-08-14 ENCOUNTER — Emergency Department
Admission: EM | Admit: 2019-08-14 | Discharge: 2019-08-14 | Disposition: A | Discharge status: Home / Self Care | Payer: PPO | Attending: Emergency Medicine | Admitting: Emergency Medicine

## 2019-08-14 DIAGNOSIS — J449 Chronic obstructive pulmonary disease, unspecified: Secondary | ICD-10-CM | POA: Diagnosis not present

## 2019-08-14 DIAGNOSIS — Z79899 Other long term (current) drug therapy: Secondary | ICD-10-CM | POA: Diagnosis not present

## 2019-08-14 DIAGNOSIS — R1030 Lower abdominal pain, unspecified: Secondary | ICD-10-CM | POA: Diagnosis not present

## 2019-08-14 DIAGNOSIS — Z951 Presence of aortocoronary bypass graft: Secondary | ICD-10-CM | POA: Insufficient documentation

## 2019-08-14 DIAGNOSIS — E119 Type 2 diabetes mellitus without complications: Secondary | ICD-10-CM | POA: Diagnosis not present

## 2019-08-14 DIAGNOSIS — R339 Retention of urine, unspecified: Secondary | ICD-10-CM | POA: Diagnosis not present

## 2019-08-14 DIAGNOSIS — Z7984 Long term (current) use of oral hypoglycemic drugs: Secondary | ICD-10-CM | POA: Diagnosis not present

## 2019-08-14 DIAGNOSIS — R350 Frequency of micturition: Secondary | ICD-10-CM | POA: Insufficient documentation

## 2019-08-14 DIAGNOSIS — R300 Dysuria: Secondary | ICD-10-CM | POA: Diagnosis present

## 2019-08-14 DIAGNOSIS — I10 Essential (primary) hypertension: Secondary | ICD-10-CM | POA: Diagnosis not present

## 2019-08-14 LAB — COMPREHENSIVE METABOLIC PANEL
ALT: 22 U/L (ref 0–44)
AST: 22 U/L (ref 15–41)
Albumin: 4.7 g/dL (ref 3.5–5.0)
Alkaline Phosphatase: 64 U/L (ref 38–126)
Anion gap: 13 (ref 5–15)
BUN: 21 mg/dL (ref 8–23)
CO2: 26 mmol/L (ref 22–32)
Calcium: 9.5 mg/dL (ref 8.9–10.3)
Chloride: 94 mmol/L — ABNORMAL LOW (ref 98–111)
Creatinine, Ser: 0.93 mg/dL (ref 0.61–1.24)
GFR calc Af Amer: >60 mL/min (ref >60)
GFR calc non Af Amer: >60 mL/min (ref >60)
Glucose, Bld: 166 mg/dL — ABNORMAL HIGH (ref 70–99)
Potassium: 4.5 mmol/L (ref 3.5–5.1)
Sodium: 133 mmol/L — ABNORMAL LOW (ref 135–145)
Total Bilirubin: 1.3 mg/dL — ABNORMAL HIGH (ref 0.3–1.2)
Total Protein: 8.6 g/dL — ABNORMAL HIGH (ref 6.5–8.1)

## 2019-08-14 LAB — CBC WITH DIFFERENTIAL/PLATELET
Abs Immature Granulocytes: 0.04 10*3/uL (ref 0.00–0.07)
Basophils Absolute: 0 10*3/uL (ref 0.0–0.1)
Basophils Relative: 0 %
Eosinophils Absolute: 0 10*3/uL (ref 0.0–0.5)
Eosinophils Relative: 0 %
HCT: 41.6 % (ref 39.0–52.0)
Hemoglobin: 14.3 g/dL (ref 13.0–17.0)
Immature Granulocytes: 0 %
Lymphocytes Relative: 9 %
Lymphs Abs: 1 10*3/uL (ref 0.7–4.0)
MCH: 31 pg (ref 26.0–34.0)
MCHC: 34.4 g/dL (ref 30.0–36.0)
MCV: 90.2 fL (ref 80.0–100.0)
Monocytes Absolute: 0.4 10*3/uL (ref 0.1–1.0)
Monocytes Relative: 3 %
Neutro Abs: 10 10*3/uL — ABNORMAL HIGH (ref 1.7–7.7)
Neutrophils Relative %: 88 %
Platelets: 234 10*3/uL (ref 150–400)
RBC: 4.61 MIL/uL (ref 4.22–5.81)
RDW: 12.1 % (ref 11.5–15.5)
WBC: 11.4 10*3/uL — ABNORMAL HIGH (ref 4.0–10.5)
nRBC: 0 % (ref 0.0–0.2)

## 2019-08-14 LAB — URINALYSIS, COMPLETE (UACMP) WITH MICROSCOPIC
Bacteria, UA: NONE SEEN
Bilirubin Urine: NEGATIVE
Glucose, UA: 150 mg/dL — AB
Hgb urine dipstick: NEGATIVE
Ketones, ur: 5 mg/dL — AB
Leukocytes,Ua: NEGATIVE
Nitrite: POSITIVE — AB
Protein, ur: NEGATIVE mg/dL
Specific Gravity, Urine: 1.016 (ref 1.005–1.030)
Squamous Epithelial / HPF: NONE SEEN (ref 0–5)
pH: 6 (ref 5.0–8.0)

## 2019-08-14 MED ORDER — CEPHALEXIN 500 MG PO CAPS
500.0000 mg | ORAL_CAPSULE | Freq: Once | ORAL | Status: AC
  Administered 2019-08-14: 500 mg via ORAL
  Filled 2019-08-14: qty 1

## 2019-08-14 MED ORDER — TAMSULOSIN HCL 0.4 MG PO CAPS: 0.8000 mg | ORAL_CAPSULE | Freq: Every day | ORAL | 0 refills | Status: AC

## 2019-08-14 MED ORDER — CEPHALEXIN 500 MG PO CAPS: 500.0000 mg | ORAL_CAPSULE | Freq: Three times a day (TID) | ORAL | 0 refills | Status: AC

## 2019-08-14 MED ORDER — CIPROFLOXACIN HCL 500 MG PO TABS
500.0000 mg | ORAL_TABLET | Freq: Once | ORAL | Status: DC
  Filled 2019-08-14: qty 1

## 2019-08-14 MED ORDER — CIPROFLOXACIN HCL 500 MG PO TABS: 500.0000 mg | ORAL_TABLET | Freq: Two times a day (BID) | ORAL | 0 refills | Status: DC

## 2019-08-14 NOTE — ED Provider Notes (Signed)
[facility] Emergency Department Provider Note  ____________________________________________   None    (approximate)   I have reviewed the triage vital signs and the nursing notes.   Patient has been triaged with a MSE exam performed by myself at a minimum. Based on symptoms and screening exam, patient may receive a more in-depth exam, labs, imaging as detailed below. Patients have been advised of this setting and exam type at the time of patient interview.    HISTORY  Chief Complaint Abdominal Pain    HPI Edward Russo is a 73 y.o. male presents to the emergency department with a complaint of presents emergency department with complaints of abdominal pain.  [facility] due to decreased urination and pain in the lower abdomen.  They gave him a prescription for Septra.  Patient has had diarrhea for a few days and is worsened with the medication.  Also feels like the medication helps him urinate a little better but not well.  Patient is unsure if he has been around anybody with Covid.  He has a cardiac history and states he feels short of breath sometimes when he walks but this is nothing new.  No vomiting..   Patient will receive a medical screening exam as detailed below.  Based off of this exam, more in depth exam, labs, imaging will be performed as needed for complaint.  Patient care will be eventually transferred to another provider in the emergency department for final exam, diagnosis and disposition.    Past Medical History:  Diagnosis Date  . 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 COPD (chronic obstructive pulmonary disease) ([place]) 10/04/2017  . Coronary artery disease involving native coronary artery of native heart 06/12/2015  . Esophageal reflux 10/04/2017  . Essential hypertension 06/12/2015  . Hyperlipidemia LDL goal <100 06/12/2015  . Hypertension   . Polio   . Type 2 diabetes mellitus ([place]) 10/04/2017    Patient Active  Problem List   Diagnosis Date Noted  . COPD (chronic obstructive pulmonary disease) ([place]) 10/04/2017  . Esophageal reflux 10/04/2017  . Type 2 diabetes mellitus ([place]) 10/04/2017  . 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 Coronary artery disease involving native coronary artery of native heart 06/12/2015  . Essential hypertension 06/12/2015  . Hyperlipidemia LDL goal <100 06/12/2015    Past Surgical History:  Procedure Laterality Date  . BACK SURGERY    . CARDIAC CATHETERIZATION  2011   patent LTA and RTA at cath in 2011   . CARDIAC SURGERY    . CORONARY ARTERY BYPASS GRAFT  1996    Prior to Admission medications   Medication Sig Start Date End Date Taking? Authorizing Provider  aspirin (GOODSENSE ASPIRIN) 325 MG tablet Take 325 mg by mouth daily.    [provider]  atorvastatin (LIPITOR) 40 MG tablet Take 40 mg by mouth daily.    [provider]  carvedilol (COREG) 3.125 MG tablet Take 1 tablet by mouth 2 (two) times daily.     [provider]  isosorbide mononitrate (IMDUR) 30 MG 24 hr tablet Take 1 tablet (30 mg total) by mouth daily. 06/01/19 08/30/19  Richardo Priest, MD  metFORMIN (GLUCOPHAGE) 850 MG tablet Take 1 tablet by mouth daily.     [provider]  nitroGLYCERIN (NITROSTAT) 0.4 MG SL tablet Place 1 tablet (0.4 mg total) under the tongue every 5 (five) minutes as needed for chest pain. 11/08/18   Richardo Priest, MD  omeprazole (PRILOSEC) 20 MG capsule Take 20 mg by mouth daily.    [provider]  tamsulosin (FLOMAX) 0.4 MG CAPS capsule Take 0.4 mg by mouth daily.     [provider]    Allergies Patient has no known allergies.  Family History  Problem Relation Age of Onset  . Asthma Mother   . Heart failure Mother   . CAD Father   . Heart attack Brother     Social History Social History   Tobacco Use  . Smoking status: Never Smoker  . Smokeless tobacco: Never Used  Substance Use Topics  .  Alcohol use: No  . Drug use: No    Review of Systems Constitutional: Denies fever ENT: Denies nasal congestion/rhinorhea.  Denies sore throat Cardiovascular: Denies chest pain. Respiratory: Denies cough.  Denies shortness of breath/difficulty breathing Gastroenterology: Positive abdominal pain, positive decreased urination, positive diarrhea Musculoskeletal:  for musculoskeletal pain Integumentary: Negative for rash. Neurological: No focal weakness nor numbness.   ____________________________________________   PHYSICAL EXAM:  VITAL SIGNS: ED Triage Vitals [08/14/19 1338]  Enc Vitals Group     BP (!) 197/97     Pulse Rate 70     Resp 17     Temp 98.3 F (36.8 C)     Temp Source Oral     SpO2 96 %     Weight 162 lb (73.5 kg)     Height 5\' 10"  (1.778 m)     Head Circumference      Peak Flow      Pain Score      Pain Loc      Pain Edu?      Excl. in GC?     Constitutional: Alert and oriented. Generally well appearing and in no acute distress. Eyes: Conjunctivae are normal.  Nose: No significant congestion/rhinnorhea. Mouth: No gross oropharyngeal edema.  Neck: No stridor.  No meningeal signs.   Cardiovascular: Grossly normal heart sounds. Respiratory: Normal respiratory effort without significant tachypnea and no observed retractions. Lungs CTA Gastrointestinal: No significant visible abdominal wall findings.  Bowel sounds x4 quadrants.  Tender to palpation. Musculoskeletal: No gross deformities of extremities. Neurologic:  Normal speech and language. No gross focal neurologic deficits are appreciated.  Skin:  Skin is warm, dry and intact. No rash noted.    ____________________________________________   LABS (all labs ordered are listed, but only abnormal results are displayed)  Labs Reviewed  URINALYSIS, COMPLETE (UACMP) WITH MICROSCOPIC  CBC WITH DIFFERENTIAL/PLATELET  COMPREHENSIVE METABOLIC PANEL    ____________________________________________    RADIOLOGY   Official radiology report(s): No results found.  ____________________________________________    INITIAL IMPRESSION / MDM / ASSESSMENT AND PLAN / ED COURSE  This Clinical Impression:     Abdominal pain, questionable urinary retention  Patient has been screened based based on their arrival complaint, evaluated for an emergent condition, and at a minimum has received a medical screening exam.  At this time, patient will receive further work-up as determined by medical screening exam.  Patient care will eventually be transferred to another provider in the emergency department for final diagnosis and disposition.    ____________________________________________  Note:  This document was prepared using and may include unintentional dictation errors.     Conservation officer, historic buildings, PA-C 08/14/19 1351    08/16/19, MD 08/14/19 [phone]

## 2019-08-14 NOTE — ED Notes (Signed)
Emptied 1450 ml from foley  Leg bag applied

## 2019-08-14 NOTE — ED Provider Notes (Signed)
[facility] Emergency Department Provider Note ____________________________________________  Time seen: 1537  I have reviewed the triage vital signs and the nursing notes.  HISTORY  Chief Complaint  Abdominal Pain  HPI Edward Russo is a 73 y.o. male presents to the ED for evaluation of ongoing dysuria  any lower abdominal pain.  Patient was seen at a local urgent care few days prior, and started on Bactrim.  Initially presented there secondary to frequent urination as well as pelvic pressure.  He denies any interim fever, chills, sweats, back pain, or chest pain.  Past Medical History:  Diagnosis Date  . 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 COPD (chronic obstructive pulmonary disease) ([place]) 10/04/2017  . Coronary artery disease involving native coronary artery of native heart 06/12/2015  . Esophageal reflux 10/04/2017  . Essential hypertension 06/12/2015  . Hyperlipidemia LDL goal <100 06/12/2015  . Hypertension   . Polio   . Type 2 diabetes mellitus ([place]) 10/04/2017    Patient Active Problem List   Diagnosis Date Noted  . COPD (chronic obstructive pulmonary disease) ([place]) 10/04/2017  . Esophageal reflux 10/04/2017  . Type 2 diabetes mellitus ([place]) 10/04/2017  . 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 Coronary artery disease involving native coronary artery of native heart 06/12/2015  . Essential hypertension 06/12/2015  . Hyperlipidemia LDL goal <100 06/12/2015    Past Surgical History:  Procedure Laterality Date  . BACK SURGERY    . CARDIAC CATHETERIZATION  2011   patent LTA and RTA at cath in 2011   . CARDIAC SURGERY    . CORONARY ARTERY BYPASS GRAFT  1996    Prior to Admission medications   Medication Sig Start Date End Date Taking? Authorizing Provider  aspirin (GOODSENSE ASPIRIN) 325 MG tablet Take 325 mg by mouth daily.    [provider]  atorvastatin (LIPITOR) 40 MG tablet Take 40 mg by mouth daily.    [provider]  carvedilol (COREG) 3.125 MG tablet Take 1 tablet by mouth 2 (two) times daily.     [provider]  cephALEXin (KEFLEX) 500 MG capsule Take 1 capsule (500 mg total) by mouth 3 (three) times daily for 7 days. 08/14/19 08/21/19  Zayden Hahne, Dannielle Karvonen, PA-C  isosorbide mononitrate (IMDUR) 30 MG 24 hr tablet Take 1 tablet (30 mg total) by mouth daily. 06/01/19 08/30/19  Richardo Priest, MD  metFORMIN (GLUCOPHAGE) 850 MG tablet Take 1 tablet by mouth daily.     [provider]  nitroGLYCERIN (NITROSTAT) 0.4 MG SL tablet Place 1 tablet (0.4 mg total) under the tongue every 5 (five) minutes as needed for chest pain. 11/08/18   Richardo Priest, MD  omeprazole (PRILOSEC) 20 MG capsule Take 20 mg by mouth daily.    [provider]  tamsulosin (FLOMAX) 0.4 MG CAPS capsule Take 0.4 mg by mouth daily.     [provider]  tamsulosin (FLOMAX) 0.4 MG CAPS capsule Take 2 capsules (0.8 mg total) by mouth daily after breakfast. 08/14/19 09/13/19  Ahava Kissoon, Dannielle Karvonen, PA-C    Allergies Patient has no known allergies.  Family History  Problem Relation Age of Onset  . Asthma Mother   . Heart failure Mother   . CAD Father   . Heart attack Brother     Social History Social History   Tobacco Use  . Smoking status: Never Smoker  . Smokeless tobacco: Never Used  Substance Use Topics  . Alcohol use: No  .  Drug use: No    Review of Systems  Constitutional: Negative for fever. Cardiovascular: Negative for chest pain. Respiratory: Negative for shortness of breath. Gastrointestinal: Negative for abdominal pain, vomiting and diarrhea. Genitourinary: Positive for dysuria. Musculoskeletal: Negative for back pain. Skin: Negative for rash. Neurological: Negative for headaches, focal weakness or numbness. ____________________________________________  PHYSICAL EXAM:  VITAL SIGNS: ED Triage Vitals [08/14/19 1338]  Enc Vitals Group     BP (!) 197/97      Pulse Rate 70     Resp 17     Temp 98.3 F (36.8 C)     Temp Source Oral     SpO2 96 %     Weight 162 lb (73.5 kg)     Height 5\' 10"  (1.778 m)     Head Circumference      Peak Flow      Pain Score      Pain Loc      Pain Edu?      Excl. in GC?     Constitutional: Alert and oriented. Well appearing and in no distress. Head: Normocephalic and atraumatic. Eyes: Conjunctivae are normal. Normal extraocular movements Cardiovascular: Normal rate, regular rhythm. Normal distal pulses. Respiratory: Normal respiratory effort. No wheezes/rales/rhonchi. Gastrointestinal: Soft and nontender. No distention. Musculoskeletal: Nontender with normal range of motion in all extremities.  Neurologic:  Normal gait without ataxia. Normal speech and language. No gross focal neurologic deficits are appreciated. Skin:  Skin is warm, dry and intact. No rash noted. Psychiatric: Mood and affect are normal. Patient exhibits appropriate insight and judgment. ____________________________________________   LABS (pertinent positives/negatives) Labs Reviewed  URINALYSIS, COMPLETE (UACMP) WITH MICROSCOPIC - Abnormal; Notable for the following components:      Result Value   Color, Urine AMBER (*)    APPearance CLEAR (*)    Glucose, UA 150 (*)    Ketones, ur 5 (*)    Nitrite POSITIVE (*)    All other components within normal limits  CBC WITH DIFFERENTIAL/PLATELET - Abnormal; Notable for the following components:   WBC 11.4 (*)    Neutro Abs 10.0 (*)    All other components within normal limits  COMPREHENSIVE METABOLIC PANEL - Abnormal; Notable for the following components:   Sodium 133 (*)    Chloride 94 (*)    Glucose, Bld 166 (*)    Total Protein 8.6 (*)    Total Bilirubin 1.3 (*)    All other components within normal limits  URINE CULTURE  ____________________________________________  PROCEDURES  Insert Foley Keflex 500 mg PO Procedures ____________________________________________  INITIAL  IMPRESSION / ASSESSMENT AND PLAN / ED COURSE  Male patient with a history of BPH, presents to the ED with 2 weeks of intermittent urinary hesitancy and a 2-day complaint of urinary retention.  He had a Foley catheter placed today, and report significant provement of his symptoms.  Patient had approximately 700 mL of orange tinted urine flow into the bag.  His urinalysis does reveal some nitrites but no significant leukocyturia or hematuria.  Patient will be discharged with a leg bag for his Foley cath, and a prescription for Keflex in addition to his previous Bactrim.  We have also increased his tamsulosin to 0.8 mg daily. He is referred to urology for further management and subsequent Foley removal.  Return precautions have been reviewed.    Trellis MomentBobby R Mandato was evaluated in Emergency Department on 08/14/2019 for the symptoms described in the history of present illness. He was evaluated in the context  of the global COVID-19 pandemic, which necessitated consideration that the patient might be at risk for infection with the SARS-CoV-2 virus that causes COVID-19. Institutional protocols and algorithms that pertain to the evaluation of patients at risk for COVID-19 are in a state of rapid change based on information released by regulatory bodies including the CDC and federal and state organizations. These policies and algorithms were followed during the patient's care in the ED. ____________________________________________  FINAL CLINICAL IMPRESSION(S) / ED DIAGNOSES  Final diagnoses:  Urinary retention      Lissa Hoard, PA-C 08/14/19 Wallace Going, MD 08/14/19 2019

## 2019-08-14 NOTE — ED Notes (Signed)
See triage note  States he is having a hard time voiding

## 2019-08-14 NOTE — ED Triage Notes (Addendum)
Pt seen at [facility] yesterday and dx with dysuria. Pt now has c/o of abdominal pain. Pt assessed by Manuela Schwartz, PA. During assessment pt admits to difficulty urinating and per PA lower abdomen distention and pain.

## 2019-08-14 NOTE — Discharge Instructions (Addendum)
You are being treated with a urinary catheter for your bladder retention. You are also having your Tamsulosin increased to 2 pills a day. Continue to take the previous Bactrim antibiotic, along with the Keflex antibiotic added today. Call Urology tomorrow to schedule follow-up.

## 2019-08-16 LAB — URINE CULTURE: Culture: NO GROWTH

## 2019-08-22 ENCOUNTER — Other Ambulatory Visit: Payer: Self-pay | Admitting: Cardiology

## 2019-08-22 DIAGNOSIS — N401 Enlarged prostate with lower urinary tract symptoms: Secondary | ICD-10-CM | POA: Diagnosis not present

## 2019-08-22 DIAGNOSIS — R338 Other retention of urine: Secondary | ICD-10-CM | POA: Diagnosis not present

## 2019-09-05 DIAGNOSIS — R5383 Other fatigue: Secondary | ICD-10-CM | POA: Diagnosis not present

## 2019-09-07 ENCOUNTER — Telehealth: Payer: Self-pay | Admitting: Cardiology

## 2019-09-07 DIAGNOSIS — R338 Other retention of urine: Secondary | ICD-10-CM | POA: Diagnosis not present

## 2019-09-07 DIAGNOSIS — N401 Enlarged prostate with lower urinary tract symptoms: Secondary | ICD-10-CM | POA: Diagnosis not present

## 2019-09-07 NOTE — Telephone Encounter (Signed)
Please call Lupita Leash his wife.. Dr. [name] started him on Isosorbide in October and he has not been able to use the bathroom since then and actually has a bag. He is at urologist appt this am. Wife is wiodering if this had anything to do with it. Please call her.

## 2019-09-07 NOTE — Telephone Encounter (Signed)
Telephone call to patient . Spoke with wife Lupita Leash. Informed her that Dr [name] said there is no connection between patient's isosorbide and his urination problems and to continue seeing the urologist. Wife verbalized understanding.

## 2019-10-17 DIAGNOSIS — R338 Other retention of urine: Secondary | ICD-10-CM | POA: Diagnosis not present

## 2019-10-25 DIAGNOSIS — R338 Other retention of urine: Secondary | ICD-10-CM | POA: Diagnosis not present

## 2019-10-31 ENCOUNTER — Other Ambulatory Visit: Payer: Self-pay | Admitting: Urology

## 2019-11-02 ENCOUNTER — Telehealth: Payer: Self-pay | Admitting: Cardiology

## 2019-11-02 NOTE — Telephone Encounter (Signed)
   Primary Cardiologist:Brian [name], MD  Chart reviewed as part of pre-operative protocol coverage. Because of Kmarion Rawl Tremain's past medical history and time since last visit, he/she will require a follow-up visit in order to better assess preoperative cardiovascular risk.  Pre-op covering staff: - Please schedule appointment and call patient to inform them. - Please contact requesting surgeon's office via preferred method (i.e, phone, fax) to inform them of need for appointment prior to surgery.  If applicable, this message will also be routed to pharmacy pool and/or primary cardiologist for input on holding anticoagulant/antiplatelet agent as requested below so that this information is available at time of patient's appointment.   [place], [place]  11/02/2019, 4:30 PM

## 2019-11-02 NOTE — Telephone Encounter (Signed)
   [facility] Pre-operative Risk Assessment    Request for surgical clearance:  1. What type of surgery is being performed? Transurethral resection of the prostate   2. When is this surgery scheduled? 11/27/19  3. What type of clearance is required (medical clearance vs. Pharmacy clearance to hold med vs. Both)? Both   4. Are there any medications that need to be held prior to surgery and how long? Asprin 5 days prior  5. Practice name and name of physician performing surgery? [facility], Dr. Karsten Ro   6. What is your office phone number [phone]   7.   What is your office fax number [phone]  8.   Anesthesia type (None, local, MAC, general) ? [place] 11/02/2019, 2:09 PM  _________________________________________________________________   (provider comments below)

## 2019-11-02 NOTE — Telephone Encounter (Signed)
Spoke with Patient's wife, patient is to have surgery on 11/27/19 at [facility]. He was diagnosed with urinary retention in December 2020. He has an enlarged prostate and will need surgery. I contacted Junious Dresser at [facility] to fill out a surgical clearance form, patient's wife is aware, I left a VM for her to call back. Sending message to triage so Dr. [name] is aware.

## 2019-11-02 NOTE — Telephone Encounter (Signed)
Sent message to schedulers to reach out to the pt with an appt for pre op clearance. Pt see's Dr. [name] in the [facility] office.

## 2019-11-06 NOTE — Telephone Encounter (Signed)
Pt has appt with Dr. [name] 11/08/19 I will forward clearance notes to Dr. [name]. I will remove from the pre op call back pool.

## 2019-11-06 NOTE — Progress Notes (Signed)
Cardiology Office Note:    Date:  11/08/2019   ID:  Edward Russo, DOB [birth year], MRN [id number]  PCP:  [name], MD  Cardiologist:  Norman Herrlich, MD    Referring MD: [name], MD    ASSESSMENT:    1. Pre-op evaluation   2. Coronary artery disease involving native coronary artery of native heart, angina presence unspecified   3. Essential hypertension   4. Hyperlipidemia LDL goal <100    PLAN:    In order of problems listed above:  1. Anticipates TURP or ablation procedures not high risk the patient CAD is stable he can simply stop aspirin for a week before surgery and resume when appropriate from a bleeding perspective.  He be appropriate for either inpatient or outpatient procedure and I think he requires any preoperative cardiac testing.  His myocardial perfusion study in the last year was low risk and his proBNP level is normal.  This places him at low risk from a cardiovascular perspective. 2. Stable CAD New York Heart Association class I continue current treatment with aspirin high intensity statin beta-blocker nitrates 3. Blood pressure target continue current treatment 4. Stable continue his current high intensity statin   Next appointment: 6 months   Medication Adjustments/Labs and Tests Ordered: Current medicines are reviewed at length with the patient today.  Concerns regarding medicines are outlined above.  Orders Placed This Encounter  Procedures  . EKG 12-Lead   No orders of the defined types were placed in this encounter.   No chief complaint on file.   History of Present Illness:    Edward Russo is a 74 y.o. 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  He anticipates surgery.  He and his wife are considering TURP or ablation after  reading information from the [facility].  His CAD is stable no angina dyspnea palpitation or syncope  He anticipates surgery 11/27/2019: Surgeon Role  Ihor Gully, MD Primary  Procedure Laterality Anesthesia  TRANSURETHRAL RESECTION OF THE PROSTATE (TURP)     MPI 05/05/2019: Study Highlights   The left ventricular ejection fraction is normal (55-65%).  Nuclear stress EF: 59%. No wall motion abnormalities  There was no ST segment deviation noted during stress.  Defect 1: There is a medium defect of moderate severity present in the basal inferolateral and mid inferolateral location.  Findings consistent with prior myocardial infarction with peri-infarct ischemia in the base to mid inferolateral wall segment.  This is an intermediate risk study.  Prior CABG   Ref Range & Units 05/09/2019   NT-Pro BNP 0 - 376 pg/mL 194     Past Medical History:  Diagnosis Date  . Aortic ectasia, thoracic (HCC) 06/12/2015  . COPD (chronic obstructive pulmonary disease) (HCC) 10/04/2017  . Coronary artery disease involving native coronary artery of native heart 06/12/2015  . Esophageal reflux 10/04/2017  . Essential hypertension 06/12/2015  . Hyperlipidemia LDL goal <100 06/12/2015  . Hypertension   . Polio   . Type 2 diabetes mellitus (HCC) 10/04/2017    Past Surgical History:  Procedure Laterality Date  . BACK SURGERY    . CARDIAC CATHETERIZATION  2011   patent LTA and RTA at cath in 2011   . CARDIAC SURGERY    . CORONARY  ARTERY BYPASS GRAFT  1996    Current Medications: Current Meds  Medication Sig  . aspirin (GOODSENSE ASPIRIN) 325 MG tablet Take 325 mg by mouth daily.  Marland Kitchen atorvastatin (LIPITOR) 40 MG tablet Take 40 mg by mouth daily.  . carvedilol (COREG) 3.125 MG tablet Take 1 tablet by mouth 2 (two) times daily.   . isosorbide mononitrate (IMDUR) 30 MG 24 hr tablet TAKE 1 TABLET BY MOUTH EVERY DAY  . metFORMIN (GLUCOPHAGE) 850 MG tablet Take 1 tablet by mouth daily.   .  nitroGLYCERIN (NITROSTAT) 0.4 MG SL tablet Place 1 tablet (0.4 mg total) under the tongue every 5 (five) minutes as needed for chest pain.  Marland Kitchen omeprazole (PRILOSEC) 20 MG capsule Take 20 mg by mouth daily.  . tamsulosin (FLOMAX) 0.4 MG CAPS capsule Take 0.4 mg by mouth daily.      Allergies:   Patient has no known allergies.   Social History   Socioeconomic History  . Marital status: Married    Spouse name: Not on file  . Number of children: Not on file  . Years of education: Not on file  . Highest education level: Not on file  Occupational History  . Not on file  Tobacco Use  . Smoking status: Never Smoker  . Smokeless tobacco: Never Used  Substance and Sexual Activity  . Alcohol use: No  . Drug use: No  . Sexual activity: Not on file  Other Topics Concern  . Not on file  Social History Narrative  . Not on file   Social Determinants of Health   Financial Resource Strain:   . Difficulty of Paying Living Expenses: Not on file  Food Insecurity:   . Worried About Charity fundraiser in the Last Year: Not on file  . Ran Out of Food in the Last Year: Not on file  Transportation Needs:   . Lack of Transportation (Medical): Not on file  . Lack of Transportation (Non-Medical): Not on file  Physical Activity:   . Days of Exercise per Week: Not on file  . Minutes of Exercise per Session: Not on file  Stress:   . Feeling of Stress : Not on file  Social Connections:   . Frequency of Communication with Friends and Family: Not on file  . Frequency of Social Gatherings with Friends and Family: Not on file  . Attends Religious Services: Not on file  . Active Member of Clubs or Organizations: Not on file  . Attends Archivist Meetings: Not on file  . Marital Status: Not on file     Family History: The patient's family history includes Asthma in his mother; CAD in his father; Heart attack in his brother; Heart failure in his mother. ROS:   Please see the history of  present illness.    All other systems reviewed and are negative.  EKGs/Labs/Other Studies Reviewed:    The following studies were reviewed today:  EKG:  EKG ordered today and personally reviewed.  The ekg ordered today demonstrates sinus rhythm and is normal  Recent Labs: 05/09/2019: NT-Pro BNP 194 08/14/2019: ALT 22; BUN 21; Creatinine, Ser 0.93; Hemoglobin 14.3; Platelets 234; Potassium 4.5; Sodium 133  Recent Lipid Panel    Component Value Date/Time   CHOL 109 05/09/2019 0950   TRIG 94 05/09/2019 0950   HDL 34 (L) 05/09/2019 0950   CHOLHDL 3.2 05/09/2019 0950   CHOLHDL 8.0 10/27/2009 0012   VLDL 36 10/27/2009 0012   LDLCALC 57 05/09/2019  7342    Physical Exam:    VS:  BP (!) 160/94   Pulse 73   Temp 98.2 F (36.8 C)   Ht 5\' 10"  (1.778 m)   Wt 164 lb (74.4 kg)   SpO2 98%   BMI 23.53 kg/m     Wt Readings from Last 3 Encounters:  11/08/19 164 lb (74.4 kg)  08/14/19 162 lb (73.5 kg)  06/01/19 162 lb 1.9 oz (73.5 kg)     GEN:  Well nourished, well developed in no acute distress HEENT: Normal NECK: No JVD; No carotid bruits LYMPHATICS: No lymphadenopathy CARDIAC: RRR, no murmurs, rubs, gallops RESPIRATORY:  Clear to auscultation without rales, wheezing or rhonchi  ABDOMEN: Soft, non-tender, non-distended MUSCULOSKELETAL:  No edema; No deformity  SKIN: Warm and dry NEUROLOGIC:  Alert and oriented x 3 PSYCHIATRIC:  Normal affect    Signed, 08/01/19, MD  11/08/2019 3:34 PM    [facility]

## 2019-11-08 ENCOUNTER — Ambulatory Visit (INDEPENDENT_AMBULATORY_CARE_PROVIDER_SITE_OTHER): Payer: PPO | Admitting: Cardiology

## 2019-11-08 ENCOUNTER — Encounter: Payer: Self-pay | Admitting: Cardiology

## 2019-11-08 ENCOUNTER — Other Ambulatory Visit: Payer: Self-pay

## 2019-11-08 VITALS — BP 160/94 | HR 73 | Temp 98.2°F | Ht 70.0 in | Wt 164.0 lb

## 2019-11-08 DIAGNOSIS — E785 Hyperlipidemia, unspecified: Secondary | ICD-10-CM

## 2019-11-08 DIAGNOSIS — Z01818 Encounter for other preprocedural examination: Secondary | ICD-10-CM | POA: Diagnosis not present

## 2019-11-08 DIAGNOSIS — I2510 Atherosclerotic heart disease of native coronary artery without angina pectoris: Secondary | ICD-10-CM

## 2019-11-08 DIAGNOSIS — I10 Essential (primary) hypertension: Secondary | ICD-10-CM | POA: Diagnosis not present

## 2019-11-08 NOTE — Patient Instructions (Addendum)
Medication Instructions:  *If you need a refill on your cardiac medications before your next appointment, please call your pharmacy*  Lab Work: If you have labs (blood work) drawn today and your tests are completely normal, you will receive your results only by: . MyChart Message (if you have MyChart) OR . A paper copy in the mail If you have any lab test that is abnormal or we need to change your treatment, we will call you to review the results.  Follow-Up: At [facility], you and your health needs are our priority.  As part of our continuing mission to provide you with exceptional heart care, we have created designated Provider Care Teams.  These Care Teams include your primary Cardiologist (physician) and Advanced Practice Providers (APPs -  Physician Assistants and Nurse Practitioners) who all work together to provide you with the care you need, when you need it.  We recommend signing up for the patient portal called "MyChart".  Sign up information is provided on this After Visit Summary.  MyChart is used to connect with patients for Virtual Visits (Telemedicine).  Patients are able to view lab/test results, encounter notes, upcoming appointments, etc.  Non-urgent messages can be sent to your provider as well.   To learn more about what you can do with MyChart, go to [email or web address].    Your next appointment:   6 month(s)  The format for your next appointment:   In Person  Provider:   You may see Brian Munley, MD or the following Advanced Practice Provider on your designated Care Team:    Caitlin Walker, FNP  

## 2019-11-13 NOTE — Addendum Note (Signed)
Addended by: Theola Sequin on: 11/13/2019 02:02 PM   Modules accepted: Orders

## 2019-11-16 DIAGNOSIS — R338 Other retention of urine: Secondary | ICD-10-CM | POA: Diagnosis not present

## 2019-11-20 DIAGNOSIS — R338 Other retention of urine: Secondary | ICD-10-CM | POA: Diagnosis not present

## 2019-11-21 ENCOUNTER — Other Ambulatory Visit: Payer: Self-pay

## 2019-11-21 ENCOUNTER — Encounter (HOSPITAL_BASED_OUTPATIENT_CLINIC_OR_DEPARTMENT_OTHER): Payer: Self-pay | Admitting: Urology

## 2019-11-21 NOTE — Progress Notes (Addendum)
ADDENDUM;spoke with Shanda Bumps zanetto pa ok to proceed.  Spoke w/ via phone for pre-op interview---wife [name] needs dos---- I stat 8              COVID test ------11-24-2019 Arrive at -------700 am 11-27-2019 NPO after ------midnight Medications to take morning of surgery -----isosorbide mononitrate,atorvastatin, carvedilol., tamsulosin, omeprazole, doxycycline Diabetic medication -----none day of surgery Patient Special Instructions -----bring all prescription medications in original containers, overnight stay instructions given Pre-Op special Istructions ----- Patient verbalized understanding of instructions that were given at this phone interview. Patient denies shortness of breath, chest pain, fever, cough a this phone interview.  Anesthesia :cabg, dm cad  PCP:dr kimberly shaw Cardiologist :dr Norman Herrlich cardiac clearance note 11-08-2019 epic Chest x-ray :none EKG :11-08-2019 Echo :none Cardiac Cath : 2011 Sleep Study/ CPAP :none Fasting Blood Sugar : patient does not check cbg at home  Blood Thinner/ Instructions /Last Dose:n/a ASA / Instructions/ Last Dose : 325 mg aspirin stop x 1 week per dr [name] instructions  Patient denies shortness of breath, chest pain, fever, and cough at this phone interview.

## 2019-11-24 ENCOUNTER — Other Ambulatory Visit (HOSPITAL_COMMUNITY)
Admission: RE | Admit: 2019-11-24 | Discharge: 2019-11-24 | Disposition: A | Discharge status: Home / Self Care | Payer: PPO | Source: Ambulatory Visit | Attending: Urology | Admitting: Urology

## 2019-11-24 DIAGNOSIS — Z20822 Contact with and (suspected) exposure to covid-19: Secondary | ICD-10-CM | POA: Insufficient documentation

## 2019-11-24 DIAGNOSIS — Z01812 Encounter for preprocedural laboratory examination: Secondary | ICD-10-CM | POA: Diagnosis not present

## 2019-11-24 LAB — SARS CORONAVIRUS 2 (TAT 6-24 HRS): SARS Coronavirus 2: NEGATIVE

## 2019-11-26 NOTE — Anesthesia Preprocedure Evaluation (Addendum)
Anesthesia Evaluation  Patient identified by MRN, date of birth, ID band Patient awake    Reviewed: Allergy & Precautions, NPO status , Patient's Chart, lab work & pertinent test results, reviewed documented beta blocker date and time   Airway Mallampati: III  TM Distance: >3 FB Neck ROM: Full    Dental no notable dental hx. (+) Dental Advisory Given, Partial Lower, Partial Upper, Caps,    Pulmonary shortness of breath and with exertion, COPD,  COPD, never smoked. No inhalers   Pulmonary exam normal breath sounds clear to auscultation       Cardiovascular Exercise Tolerance: Poor METS: < 3 Mets hypertension, Pt. on medications and Pt. on home beta blockers + CAD and + CABG  Normal cardiovascular exam Rhythm:Regular Rate:Normal  CABG 1999, occasional chest pain and SOB since then but follow regularly with caridology  HLD  Stress test 05/2019:  The left ventricular ejection fraction is normal (55-65%).  Nuclear stress EF: 59%. No wall motion abnormalities  There was no ST segment deviation noted during stress.  Defect 1: There is a medium defect of moderate severity present in the basal inferolateral and mid inferolateral location.  Findings consistent with prior myocardial infarction with peri-infarct ischemia in the base to mid inferolateral wall segment.  This is an intermediate risk study.  Prior CABG   Neuro/Psych negative neurological ROS  negative psych ROS   GI/Hepatic Neg liver ROS, GERD  Medicated and Controlled,  Endo/Other  diabetes, Well Controlled, Type 2, Oral Hypoglycemic Agents  Renal/GU negative Renal ROS   BPH    Musculoskeletal negative musculoskeletal ROS (+)   Abdominal Normal abdominal exam  (+)   Peds  Hematology negative hematology ROS (+)   Anesthesia Other Findings   Reproductive/Obstetrics negative OB ROS                           Anesthesia  Physical Anesthesia Plan  ASA: III  Anesthesia Plan: General   Post-op Pain Management:    Induction: Intravenous  PONV Risk Score and Plan: 3 and Ondansetron, Dexamethasone and Treatment may vary due to age or medical condition  Airway Management Planned: LMA  Additional Equipment: None  Intra-op Plan:   Post-operative Plan: Extubation in OR  Informed Consent: I have reviewed the patients History and Physical, chart, labs and discussed the procedure including the risks, benefits and alternatives for the proposed anesthesia with the patient or authorized representative who has indicated his/her understanding and acceptance.     Dental advisory given  Plan Discussed with: CRNA  Anesthesia Plan Comments:         Anesthesia Quick Evaluation

## 2019-11-26 NOTE — Discharge Instructions (Signed)

## 2019-11-26 NOTE — H&P (Signed)
: Edward Russo is a 74 year-old male with BPH and urinary retention.  His problem was discovered 08/14/2019. His current symptoms did not begin after he had a surgical procedure. He currently has an indwelling catheter. The catheter has been in his bladder for 08/14/2019. His urinary retention is being treated with foley catheter and flomax.   His urine has shut off completely.   He has not previously had an indwelling catheter in for more than two weeks at a time. This condition would be considered of mild to moderate severity with no modifying factors or associated signs or symptoms other than as noted above.   08/22/19: The patient was seen in the emergency room on 08/14/19 with complaints of dysuria and lower abdominal pain. He had been seen at an urgent care a few days previous to his visit and was started on Bactrim after reporting frequency and pelvic pressure. He also was experiencing hesitancy when seen in the ER and a Foley catheter was inserted with 700 cc of urine drained. He was started on Keflex in addition to the Bactrim he was already taking despite urine that was completely clear of any red or white cells. A culture was performed and found to be negative. His tamsulosin was increased to 0.8 mg. His urine was negative.  He reports that as a baseline he gets up 2-3 times at night and does have some occasional episodes of hesitancy. He had been taking tamsulosin at a dosage of 0.4 mg.   09/07/19: Patient with above-noted history. He underwent voiding trial on 12/22, but return later that afternoon with PVR of approximately 600 cc in indwelling catheter was replaced. He returns today for repeat voiding trial. He continues to tolerate the catheter in his been draining well. He denies gross hematuria. He denies fevers or chills. He has been using 0.8 mg dosage of tamsulosin and tolerating this well.   10/17/19: The patient returns today having undergone urodynamics to further evaluate his urinary  retention.     ALLERGIES: None   MEDICATIONS: Aspirin  Metformin Hcl  Omeprazole  Tamsulosin Hcl 0.4 mg capsule 1 tablet PO BID  Tamsulosin Hcl  Atorvastatin Calcium  Carvedilol  Isosorbide  Nitroglycerin     GU PSH: Complex cystometrogram, w/ void pressure and urethral pressure profile studies, any technique - 10/17/2019 Complex Uroflow - 10/17/2019 Emg surf Electrd - 10/17/2019 Inject For cystogram - 10/17/2019 Intrabd voidng Press - 10/17/2019     NON-GU PSH: Back Surgery (Unspecified) - 2002 Heart Surgery (Unspecified) - 1996     GU PMH: Urinary Retention - 09/07/2019, - 08/22/2019 BPH w/LUTS, Recommended he ran on his tamsulosin dosage of 0.8 mg. - 08/22/2019    NON-GU PMH: Acute poliomyelitis, unspecified Diabetes Type 2 GERD Heart disease, unspecified Hypercholesterolemia Hypertension    FAMILY HISTORY: Asthma - Mother, Brother heart - Father   SOCIAL HISTORY: Marital Status: Married Current Smoking Status: Patient has never smoked.   Tobacco Use Assessment Completed: Used Tobacco in last 30 days? Has never drank.  Drinks 4+ caffeinated drinks per day.    REVIEW OF SYSTEMS:    GU Review Male:   Patient denies frequent urination, hard to postpone urination, burning/ pain with urination, get up at night to urinate, leakage of urine, stream starts and stops, trouble starting your stream, have to strain to urinate , erection problems, and penile pain.  Gastrointestinal (Upper):   Patient denies nausea, vomiting, and indigestion/ heartburn.  Gastrointestinal (Lower):   Patient denies diarrhea and  constipation.  Constitutional:   Patient denies fever, night sweats, weight loss, and fatigue.  Skin:   Patient denies skin rash/ lesion and itching.  Eyes:   Patient denies blurred vision and double vision.  Ears/ Nose/ Throat:   Patient denies sore throat and sinus problems.  Hematologic/Lymphatic:   Patient denies swollen glands and easy bruising.  Cardiovascular:    Patient denies leg swelling and chest pains.  Respiratory:   Patient denies cough and shortness of breath.  Endocrine:   Patient denies excessive thirst.  Musculoskeletal:   Patient denies back pain and joint pain.  Neurological:   Patient denies headaches and dizziness.  Psychologic:   Patient denies depression and anxiety.   VITAL SIGNS:  BP 153/69 mmHg  Pulse 65 /min  Temperature 98.7 F / 37.0 C   GU PHYSICAL EXAMINATION:    Anus and Perineum: No hemorrhoids. No anal stenosis. No rectal fissure, no anal fissure. No edema, no dimple, no perineal tenderness, no anal tenderness.  Scrotum: No lesions. No edema. No cysts. No warts.  Epididymides: Right: no spermatocele, no masses, no cysts, no tenderness, no induration, no enlargement. Left: no spermatocele, no masses, no cysts, no tenderness, no induration, no enlargement.  Testes: No tenderness, no swelling, no enlargement left testes. No tenderness, no swelling, no enlargement right testes. Normal location left testes. Normal location right testes. No mass, no cyst, no varicocele, no hydrocele left testes. No mass, no cyst, no varicocele, no hydrocele right testes.  Urethral Meatus: Normal size. No lesion, no wart, no discharge, no polyp. Normal location.  Penis: Penile foley catheter present. Circumcised, no foreskin warts, no cracks. No dorsal peyronie's plaques, no left corporal peyronie's plaques, no right corporal peyronie's plaques, no scarring, no shaft warts. No balanitis, no meatal stenosis.   Prostate: Prostate 2 1/2+ size. Left lobe normal consistency, right lobe normal consistency. Symmetrical lobes. No prostate nodule. Left lobe no tenderness, right lobe no tenderness.   Seminal Vesicles: Nonpalpable.  Sphincter Tone: Normal sphincter. No rectal tenderness. No rectal mass.    MULTI-SYSTEM PHYSICAL EXAMINATION:    Constitutional: Well-nourished. No physical deformities. Normally developed. Good grooming.  Neck: Neck symmetrical,  not swollen. Normal tracheal position.  Respiratory: No labored breathing, no use of accessory muscles.   Cardiovascular: Normal temperature, normal extremity pulses, no swelling, no varicosities.  Lymphatic: No enlargement of neck, axillae, groin.  Skin: No paleness, no jaundice, no cyanosis. No lesion, no ulcer, no rash.  Neurologic / Psychiatric: Oriented to time, oriented to place, oriented to person. No depression, no anxiety, no agitation.  Gastrointestinal: No mass, no tenderness, no rigidity, non obese abdomen.  Eyes: Normal conjunctivae. Normal eyelids.  Ears, Nose, Mouth, and Throat: Left ear no scars, no lesions, no masses. Right ear no scars, no lesions, no masses. Nose no scars, no lesions, no masses. Normal hearing. Normal lips.  Musculoskeletal: Normal gait and station of head and neck.    PAST DATA REVIEWED:  Source Of History:  Patient  Records Review:   Previous Patient Records, POC Tool  Urine Test Review:   Urine Culture  Urodynamics Review:   Review Urodynamics Tests  Notes:                     Urodynamics 10/17/19: Study is performed to evaluate urinary retention.   CMG: He was found to have a maximum cystometric capacity of 450 cc with normal sensation and some detrusor instability with an unstable contraction occurring at 185 cc  with a maximum contraction pressure of only 10 cm H2O.  Pressure-flow: He was able to generate a voluntary contraction but was unable to void with a maximum detrusor contraction pressure of 50 cm H2O.  Fluoroscopy: Mild trabeculation with elevated bladder base.   Impression he has a normal cystometric capacity with some instability that is very mild. He does have a voluntary contraction and generates adequate pressure but is unable to void indicating he would respond favorably to outlet resistance surgery.    ASSESSMENT/PLAN:      ICD-10 Details  1 GU:   Urinary Retention -   I reviewed his urodynamics study results with he and his wife. It  reveals outlet obstruction is the cause of his urinary retention with an adequate detrusor contraction so we discussed the various means of managing his urinary retention. Discussed continued indwelling Foley catheter, suprapubic tube, self catheterization and surgical outlet resistance procedures. We specifically discussed office based procedures such as Urolift and Rezum verses transurethral resection of the prostate as the gold standard. He was most interested in TURP so I went over the procedure with him in detail including how the procedure would be performed and drew him a picture. We also discussed the probability of success, the need for an overnight stay, the risks and complications and the anticipated postoperative course. He understands and has elected to proceed with transurethral resection.   A preoperative urine culture was performed and was positive for Enterobacter which was sensitive to doxycycline.  He was started on this 5 days prior to surgery and remains on that.  The organism was also sensitive to Cipro.

## 2019-11-27 ENCOUNTER — Ambulatory Visit (HOSPITAL_BASED_OUTPATIENT_CLINIC_OR_DEPARTMENT_OTHER): Payer: PPO | Admitting: Physician Assistant

## 2019-11-27 ENCOUNTER — Encounter (HOSPITAL_BASED_OUTPATIENT_CLINIC_OR_DEPARTMENT_OTHER): Payer: Self-pay | Admitting: Urology

## 2019-11-27 ENCOUNTER — Ambulatory Visit (HOSPITAL_BASED_OUTPATIENT_CLINIC_OR_DEPARTMENT_OTHER)
Admission: RE | Admit: 2019-11-27 | Discharge: 2019-11-28 | Disposition: A | Discharge status: Home / Self Care | Payer: PPO | Attending: Urology | Admitting: Urology

## 2019-11-27 ENCOUNTER — Encounter (HOSPITAL_BASED_OUTPATIENT_CLINIC_OR_DEPARTMENT_OTHER)
Admission: RE | Disposition: A | Discharge status: Home / Self Care | Payer: Self-pay | Source: Home / Self Care | Attending: Urology

## 2019-11-27 ENCOUNTER — Other Ambulatory Visit: Payer: Self-pay

## 2019-11-27 DIAGNOSIS — R338 Other retention of urine: Secondary | ICD-10-CM | POA: Insufficient documentation

## 2019-11-27 DIAGNOSIS — I2510 Atherosclerotic heart disease of native coronary artery without angina pectoris: Secondary | ICD-10-CM | POA: Diagnosis not present

## 2019-11-27 DIAGNOSIS — R0602 Shortness of breath: Secondary | ICD-10-CM | POA: Insufficient documentation

## 2019-11-27 DIAGNOSIS — Z825 Family history of asthma and other chronic lower respiratory diseases: Secondary | ICD-10-CM | POA: Insufficient documentation

## 2019-11-27 DIAGNOSIS — N320 Bladder-neck obstruction: Secondary | ICD-10-CM | POA: Diagnosis not present

## 2019-11-27 DIAGNOSIS — J449 Chronic obstructive pulmonary disease, unspecified: Secondary | ICD-10-CM | POA: Diagnosis not present

## 2019-11-27 DIAGNOSIS — E7800 Pure hypercholesterolemia, unspecified: Secondary | ICD-10-CM | POA: Insufficient documentation

## 2019-11-27 DIAGNOSIS — R350 Frequency of micturition: Secondary | ICD-10-CM | POA: Diagnosis not present

## 2019-11-27 DIAGNOSIS — N138 Other obstructive and reflux uropathy: Secondary | ICD-10-CM | POA: Diagnosis present

## 2019-11-27 DIAGNOSIS — Z8249 Family history of ischemic heart disease and other diseases of the circulatory system: Secondary | ICD-10-CM | POA: Diagnosis not present

## 2019-11-27 DIAGNOSIS — E118 Type 2 diabetes mellitus with unspecified complications: Secondary | ICD-10-CM | POA: Diagnosis not present

## 2019-11-27 DIAGNOSIS — N400 Enlarged prostate without lower urinary tract symptoms: Secondary | ICD-10-CM | POA: Diagnosis not present

## 2019-11-27 DIAGNOSIS — E119 Type 2 diabetes mellitus without complications: Secondary | ICD-10-CM | POA: Diagnosis not present

## 2019-11-27 DIAGNOSIS — Z79899 Other long term (current) drug therapy: Secondary | ICD-10-CM | POA: Diagnosis not present

## 2019-11-27 DIAGNOSIS — Z7984 Long term (current) use of oral hypoglycemic drugs: Secondary | ICD-10-CM | POA: Diagnosis not present

## 2019-11-27 DIAGNOSIS — E785 Hyperlipidemia, unspecified: Secondary | ICD-10-CM | POA: Insufficient documentation

## 2019-11-27 DIAGNOSIS — Z951 Presence of aortocoronary bypass graft: Secondary | ICD-10-CM | POA: Insufficient documentation

## 2019-11-27 DIAGNOSIS — I10 Essential (primary) hypertension: Secondary | ICD-10-CM | POA: Insufficient documentation

## 2019-11-27 DIAGNOSIS — K219 Gastro-esophageal reflux disease without esophagitis: Secondary | ICD-10-CM | POA: Diagnosis not present

## 2019-11-27 DIAGNOSIS — N401 Enlarged prostate with lower urinary tract symptoms: Secondary | ICD-10-CM | POA: Insufficient documentation

## 2019-11-27 HISTORY — DX: Other obstructive and reflux uropathy: N13.8

## 2019-11-27 HISTORY — PX: TRANSURETHRAL RESECTION OF PROSTATE: SHX73

## 2019-11-27 LAB — POCT I-STAT, CHEM 8
BUN: 18 mg/dL (ref 8–23)
Calcium, Ion: 1.24 mmol/L (ref 1.15–1.40)
Chloride: 105 mmol/L (ref 98–111)
Creatinine, Ser: 0.9 mg/dL (ref 0.61–1.24)
Glucose, Bld: 127 mg/dL — ABNORMAL HIGH (ref 70–99)
HCT: 39 % (ref 39.0–52.0)
Hemoglobin: 13.3 g/dL (ref 13.0–17.0)
Potassium: 3.7 mmol/L (ref 3.5–5.1)
Sodium: 143 mmol/L (ref 135–145)
TCO2: 26 mmol/L (ref 22–32)

## 2019-11-27 LAB — GLUCOSE, CAPILLARY
Glucose-Capillary: 156 mg/dL — ABNORMAL HIGH (ref 70–99)
Glucose-Capillary: 210 mg/dL — ABNORMAL HIGH (ref 70–99)
Glucose-Capillary: 190 mg/dL — ABNORMAL HIGH (ref 70–99)

## 2019-11-27 SURGERY — TURP (TRANSURETHRAL RESECTION OF PROSTATE)
Anesthesia: General | Site: Prostate

## 2019-11-27 MED ORDER — CARVEDILOL 3.125 MG PO TABS
3.1250 mg | ORAL_TABLET | Freq: Two times a day (BID) | ORAL | Status: DC
  Administered 2019-11-27 – 2019-11-28 (×2): 3.125 mg via ORAL
  Filled 2019-11-27: qty 1

## 2019-11-27 MED ORDER — ACETAMINOPHEN 325 MG PO TABS
650.0000 mg | ORAL_TABLET | ORAL | Status: DC | PRN
  Filled 2019-11-27: qty 2

## 2019-11-27 MED ORDER — BACITRACIN-NEOMYCIN-POLYMYXIN 400-5-5000 EX OINT
1.0000 "application " | TOPICAL_OINTMENT | Freq: Three times a day (TID) | CUTANEOUS | Status: DC | PRN
  Administered 2019-11-27: 1 via TOPICAL
  Filled 2019-11-27: qty 1

## 2019-11-27 MED ORDER — CIPROFLOXACIN IN D5W 400 MG/200ML IV SOLN
400.0000 mg | Freq: Two times a day (BID) | INTRAVENOUS | Status: DC
  Administered 2019-11-27: 400 mg via INTRAVENOUS
  Filled 2019-11-27: qty 200

## 2019-11-27 MED ORDER — DEXAMETHASONE SODIUM PHOSPHATE 10 MG/ML IJ SOLN
INTRAMUSCULAR | Status: DC | PRN
  Administered 2019-11-27: 5 mg via INTRAVENOUS

## 2019-11-27 MED ORDER — ONDANSETRON HCL 4 MG/2ML IJ SOLN
INTRAMUSCULAR | Status: DC | PRN
  Administered 2019-11-27: 4 mg via INTRAVENOUS

## 2019-11-27 MED ORDER — BELLADONNA ALKALOIDS-OPIUM 16.2-60 MG RE SUPP
1.0000 | Freq: Four times a day (QID) | RECTAL | Status: DC | PRN
  Administered 2019-11-27: 1 via RECTAL
  Filled 2019-11-27: qty 1

## 2019-11-27 MED ORDER — FENTANYL CITRATE (PF) 100 MCG/2ML IJ SOLN
INTRAMUSCULAR | Status: DC | PRN
  Administered 2019-11-27: 50 ug via INTRAVENOUS
  Administered 2019-11-27: 25 ug via INTRAVENOUS

## 2019-11-27 MED ORDER — CIPROFLOXACIN IN D5W 400 MG/200ML IV SOLN
INTRAVENOUS | Status: AC
  Filled 2019-11-27: qty 200

## 2019-11-27 MED ORDER — PHENYLEPHRINE HCL-NACL 20-0.9 MG/250ML-% IV SOLN
INTRAVENOUS | Status: DC | PRN
  Administered 2019-11-27: 20 ug/min via INTRAVENOUS

## 2019-11-27 MED ORDER — FENTANYL CITRATE (PF) 100 MCG/2ML IJ SOLN
INTRAMUSCULAR | Status: AC
  Filled 2019-11-27: qty 2

## 2019-11-27 MED ORDER — PHENYLEPHRINE HCL (PRESSORS) 10 MG/ML IV SOLN
INTRAVENOUS | Status: DC | PRN
  Administered 2019-11-27 (×3): 80 ug via INTRAVENOUS

## 2019-11-27 MED ORDER — ACETAMINOPHEN 500 MG PO TABS
1000.0000 mg | ORAL_TABLET | Freq: Once | ORAL | Status: AC
  Administered 2019-11-27: 1000 mg via ORAL
  Filled 2019-11-27: qty 2

## 2019-11-27 MED ORDER — ONDANSETRON HCL 4 MG/2ML IJ SOLN
INTRAMUSCULAR | Status: AC
  Filled 2019-11-27: qty 2

## 2019-11-27 MED ORDER — LACTATED RINGERS IV SOLN
INTRAVENOUS | Status: DC
  Administered 2019-11-27: 50 mL/h via INTRAVENOUS
  Filled 2019-11-27: qty 1000

## 2019-11-27 MED ORDER — CIPROFLOXACIN IN D5W 400 MG/200ML IV SOLN
INTRAVENOUS | Status: DC | PRN
  Administered 2019-11-27: 400 mg via INTRAVENOUS

## 2019-11-27 MED ORDER — FENTANYL CITRATE (PF) 100 MCG/2ML IJ SOLN
25.0000 ug | INTRAMUSCULAR | Status: DC | PRN
  Filled 2019-11-27: qty 1

## 2019-11-27 MED ORDER — WHITE PETROLATUM EX OINT
TOPICAL_OINTMENT | CUTANEOUS | Status: AC
  Filled 2019-11-27: qty 5

## 2019-11-27 MED ORDER — PROPOFOL 10 MG/ML IV BOLUS
INTRAVENOUS | Status: DC | PRN
  Administered 2019-11-27: 30 mg via INTRAVENOUS
  Administered 2019-11-27: 100 mg via INTRAVENOUS

## 2019-11-27 MED ORDER — ONDANSETRON HCL 4 MG/2ML IJ SOLN
4.0000 mg | Freq: Once | INTRAMUSCULAR | Status: AC | PRN
  Administered 2019-11-27: 4 mg via INTRAVENOUS
  Filled 2019-11-27: qty 2

## 2019-11-27 MED ORDER — METFORMIN HCL 850 MG PO TABS
850.0000 mg | ORAL_TABLET | Freq: Two times a day (BID) | ORAL | Status: DC
  Administered 2019-11-27 – 2019-11-28 (×2): 850 mg via ORAL
  Filled 2019-11-27: qty 1

## 2019-11-27 MED ORDER — ACETAMINOPHEN 500 MG PO TABS
ORAL_TABLET | ORAL | Status: AC
  Filled 2019-11-27: qty 2

## 2019-11-27 MED ORDER — ALBUMIN HUMAN 5 % IV SOLN
INTRAVENOUS | Status: AC
  Filled 2019-11-27: qty 250

## 2019-11-27 MED ORDER — SODIUM CHLORIDE 0.9 % IR SOLN
Status: DC | PRN
  Administered 2019-11-27 (×2): 3000 mL
  Administered 2019-11-27 (×4): 6000 mL

## 2019-11-27 MED ORDER — SODIUM CHLORIDE 0.9 % IR SOLN
3000.0000 mL | Status: DC
  Filled 2019-11-27: qty 3000

## 2019-11-27 MED ORDER — BACITRACIN-NEOMYCIN-POLYMYXIN OINTMENT TUBE
TOPICAL_OINTMENT | CUTANEOUS | Status: AC
  Filled 2019-11-27: qty 14.17

## 2019-11-27 MED ORDER — LIDOCAINE 2% (20 MG/ML) 5 ML SYRINGE
INTRAMUSCULAR | Status: AC
  Filled 2019-11-27: qty 5

## 2019-11-27 MED ORDER — BELLADONNA ALKALOIDS-OPIUM 16.2-30 MG RE SUPP
RECTAL | Status: AC
  Filled 2019-11-27: qty 1

## 2019-11-27 MED ORDER — HYDROCODONE-ACETAMINOPHEN 5-325 MG PO TABS
1.0000 | ORAL_TABLET | ORAL | Status: DC | PRN
  Filled 2019-11-27: qty 2

## 2019-11-27 MED ORDER — SODIUM CHLORIDE 0.9 % IV SOLN
INTRAVENOUS | Status: DC
  Filled 2019-11-27 (×2): qty 1000

## 2019-11-27 MED ORDER — ALBUMIN HUMAN 5 % IV SOLN
12.5000 g | Freq: Once | INTRAVENOUS | Status: AC
  Administered 2019-11-27: 12.5 g via INTRAVENOUS
  Filled 2019-11-27: qty 250

## 2019-11-27 MED ORDER — PROPOFOL 10 MG/ML IV BOLUS
INTRAVENOUS | Status: AC
  Filled 2019-11-27: qty 20

## 2019-11-27 MED ORDER — LIDOCAINE 2% (20 MG/ML) 5 ML SYRINGE
INTRAMUSCULAR | Status: DC | PRN
  Administered 2019-11-27: 60 mg via INTRAVENOUS

## 2019-11-27 MED ORDER — DEXAMETHASONE SODIUM PHOSPHATE 10 MG/ML IJ SOLN
INTRAMUSCULAR | Status: AC
  Filled 2019-11-27: qty 1

## 2019-11-27 MED ORDER — ONDANSETRON HCL 4 MG/2ML IJ SOLN
4.0000 mg | INTRAMUSCULAR | Status: DC | PRN
  Filled 2019-11-27: qty 2

## 2019-11-27 SURGICAL SUPPLY — 19 items
BAG DRAIN URO-CYSTO SKYTR STRL (DRAIN) ×2 IMPLANT
BAG URINE DRAIN 2000ML AR STRL (UROLOGICAL SUPPLIES) ×2 IMPLANT
CATH FOLEY 3WAY 30CC 20FR (CATHETERS) ×2 IMPLANT
CLOTH BEACON ORANGE TIMEOUT ST (SAFETY) ×2 IMPLANT
EVACUATOR MICROVAS BLADDER (UROLOGICAL SUPPLIES) ×2 IMPLANT
GLOVE BIO SURGEON STRL SZ8 (GLOVE) ×2 IMPLANT
GLOVE BIOGEL PI IND STRL 7.5 (GLOVE) ×2 IMPLANT
GLOVE BIOGEL PI INDICATOR 7.5 (GLOVE) ×2
GOWN STRL REUS W/TWL LRG LVL3 (GOWN DISPOSABLE) ×4 IMPLANT
HOLDER FOLEY CATH W/STRAP (MISCELLANEOUS) ×2 IMPLANT
IV NS IRRIG 3000ML ARTHROMATIC (IV SOLUTION) ×20 IMPLANT
KIT TURNOVER CYSTO (KITS) ×2 IMPLANT
LOOP CUT BIPOLAR 24F LRG (ELECTROSURGICAL) ×4 IMPLANT
MANIFOLD NEPTUNE II (INSTRUMENTS) ×2 IMPLANT
NS IRRIG 1000ML POUR BTL (IV SOLUTION) ×2 IMPLANT
PACK CYSTO (CUSTOM PROCEDURE TRAY) ×2 IMPLANT
TUBE CONNECTING 12X1/4 (SUCTIONS) ×2 IMPLANT
TUBING UROLOGY SET (TUBING) ×2 IMPLANT
WATER STERILE IRR 500ML POUR (IV SOLUTION) ×2 IMPLANT

## 2019-11-27 NOTE — Transfer of Care (Signed)
Immediate Anesthesia Transfer of Care Note  Patient: Edward Russo  Procedure(s) Performed: Procedure(s) (LRB): TRANSURETHRAL RESECTION OF THE PROSTATE (TURP) (N/A)  Patient Location: PACU  Anesthesia Type: General  Level of Consciousness: awake, oriented, sedated and patient cooperative  Airway & Oxygen Therapy: Patient Spontanous Breathing and Patient connected to face mask oxygen  Post-op Assessment: Report given to PACU RN and Post -op Vital signs reviewed and stable  Post vital signs: Reviewed and stable  Complications: No apparent anesthesia complications Last Vitals:  Vitals Value Taken Time  BP 128/81 11/27/19 1017  Temp    Pulse 65 11/27/19 1018  Resp 17 11/27/19 1018  SpO2 100 % 11/27/19 1018  Vitals shown include unvalidated device data.  Last Pain:  Vitals:   11/27/19 0745  TempSrc: Oral  PainSc: 0-No pain      Patients Stated Pain Goal: 4 (11/27/19 0745)

## 2019-11-27 NOTE — OR Nursing (Signed)
Foley catheter was removed at 0904H. 75 cc urine output noted

## 2019-11-27 NOTE — Op Note (Signed)
PATIENT:  Edward Russo  PRE-OPERATIVE DIAGNOSIS: 1.  BPH with outlet obstruction  2.  Urinary retention  POST-OPERATIVE DIAGNOSIS: Same  PROCEDURE: TURP  SURGEON:  Garnett Farm  INDICATION: Edward Russo is a 74 year old male who initially developed urinary retention in 12/20.  He was initially placed on tamsulosin and underwent voiding trials there were unsuccessful therefore urodynamics were undertaken that revealed an adequate detrusor contraction with outlet obstruction and therefore we discussed the management options.  He elected to proceed with a transurethral resection of the prostate.  He has been taking antibiotics for 5 days preoperatively based on his preop urine culture.  ANESTHESIA:  General  EBL:  Minimal  DRAINS: 20 [place], three-way Foley catheter  SPECIMEN:  Prostate chips to pathology  After informed consent the patient was brought to the major OR and placed on the table. He was administered general anesthesia and then moved to the dorsal lithotomy position. His genitalia was sterilely prepped and draped and an official timeout was then performed.  Initially the 28 French resectoscope sheath with the visual obturator was passed into the bladder and the obturator removed. I then inserted the resectoscope element with 30 lens and  performed a systematic inspection of the bladder. I noted the bladder had 3+ trabeculation with multiple shallow cellules.  It was noted to be free of any tumor stones or inflammatory lesions. The ureteral orifices were noted to be of normal configuration and position. Withdrawing the scope into the prostatic urethra I noted obstructing bilobar hypertrophy with elongation of the prostatic urethra and a minimal median lobe component.  Resection was then begun I. first resected the median lobe in the midline down to the level of the bladder neck. I then began resecting the left lobe of the prostate by resecting first from the level of the bladder  neck back to the level of the Veru at the 5:00 position and then progressed in a counterclockwise direction resecting all of the adenomatous tissue of the left lobe down to the surgical capsule. Bleeding points were cauterized as they were encountered. I then turned my attention to the right lobe of the prostate and it was resected in an identical fashion. Tissue in the area of the apex was then resected circumferentially with care being taken to maintain the resection proximal to the Veru at all times. The prostatic chips were then flushed into the bladder and the Microvasive evacuator was then used to evacuate all chips from the bladder. Reinspection of the bladder revealed the mucosa to be intact, the ureteral orifices intact as well and well away from the bladder neck and area of resection. There were no prostatic chips remaining within the bladder. The prostatic capsule was intact throughout with no perforation and there was no active bleeding noted at the end of the procedure.  The resectoscope was therefore removed and the 20 French three-way Foley catheter was then inserted and balloon was filled to 30 cc. This was placed on mild traction and the bladder was irrigated with the irrigant returning clear. The catheter was then hooked to closed system drainage and continuous irrigation and the patient was awakened and taken to recovery room in stable and satisfactory condition. He tolerated the procedure well and there were no intraoperative complications.  PLAN OF CARE: Observation overnight with anticipated discharge in the morning.  PATIENT DISPOSITION:  PACU - Hemodynamically stable.

## 2019-11-27 NOTE — Anesthesia Postprocedure Evaluation (Signed)
Anesthesia Post Note  Patient: Edward Russo  Procedure(s) Performed: TRANSURETHRAL RESECTION OF THE PROSTATE (TURP) (N/A Prostate)     Patient location during evaluation: PACU Anesthesia Type: General Level of consciousness: awake and alert, oriented and patient cooperative Pain management: pain level controlled Vital Signs Assessment: post-procedure vital signs reviewed and stable Respiratory status: spontaneous breathing, nonlabored ventilation and respiratory function stable Cardiovascular status: blood pressure returned to baseline and stable Postop Assessment: no apparent nausea or vomiting Anesthetic complications: no Comments: Frequent PACs intraop and in PACU, obvious p waves on telemetry. Recovery care RN states that patient must have 12lead to rule out Afib prior to transfer to recovery care- EKG confirmed PACs, not in Afib.    Last Vitals:  Vitals:   11/27/19 1115 11/27/19 1130  BP: 139/69 (!) 144/76  Pulse: (!) 45 (!) 39  Resp: (!) 24 (!) 23  Temp:    SpO2: 100% 99%    Last Pain:  Vitals:   11/27/19 1130  TempSrc:   PainSc: 0-No pain                 Lannie Fields

## 2019-11-27 NOTE — Anesthesia Procedure Notes (Signed)
Procedure Name: LMA Insertion Date/Time: 11/27/2019 9:01 AM Performed by: Francie Massing, CRNA Pre-anesthesia Checklist: Patient identified, Emergency Drugs available, Suction available and Patient being monitored Patient Re-evaluated:Patient Re-evaluated prior to induction Oxygen Delivery Method: Circle system utilized Preoxygenation: Pre-oxygenation with 100% oxygen Induction Type: IV induction Ventilation: Mask ventilation without difficulty LMA: LMA inserted LMA Size: 4.0 Number of attempts: 1 Airway Equipment and Method: Bite block Placement Confirmation: positive ETCO2 Tube secured with: Tape Dental Injury: Teeth and Oropharynx as per pre-operative assessment

## 2019-11-28 DIAGNOSIS — N401 Enlarged prostate with lower urinary tract symptoms: Secondary | ICD-10-CM | POA: Diagnosis not present

## 2019-11-28 LAB — GLUCOSE, CAPILLARY: Glucose-Capillary: 86 mg/dL (ref 70–99)

## 2019-11-28 LAB — SURGICAL PATHOLOGY

## 2019-11-28 MED ORDER — PHENAZOPYRIDINE HCL 200 MG PO TABS: 200.0000 mg | ORAL_TABLET | Freq: Three times a day (TID) | ORAL | 0 refills | Status: DC | PRN

## 2019-11-28 MED ORDER — HYDROCODONE-ACETAMINOPHEN 10-325 MG PO TABS: 1.0000 | ORAL_TABLET | ORAL | 0 refills | Status: DC | PRN

## 2019-11-28 NOTE — Discharge Summary (Signed)
Physician Discharge Summary      Patient ID: Edward Russo MRN: [id number] DOB/AGE: Nov 01, 1945 74 y.o.  Admit date: 11/27/2019 Discharge date: 11/28/2019  Admission Diagnoses: BPH with urinary obstruction [N40.1, N13.8]  Discharge Diagnoses:  Active Problems:   BPH with urinary obstruction   Discharged Condition: good  Hospital Course: Edward Russo was electively admitted and underwent transurethral resection of the prostate on 11/27/2019.  This proceeded without complication and he was observed overnight with a Foley catheter indwelling on continuous bladder irrigation.  His urine has remained nearly clear.  He is not having any discomfort.  He is tolerating a regular diet.  His Foley catheter will be removed and he will be discharged home after he voids.  Discharge Exam: Blood pressure (!) 144/91, pulse (!) 55, temperature 98.1 F (36.7 C), resp. rate 20, height 5\' 10"  (1.778 m), weight 79.8 kg, SpO2 96 %. General: Awake, alert and in no apparent distress. Chest: Normal respiratory effort. Cardiovascular: Regular rate and rhythm. Abdomen: Soft, nontender, nondistended.   Disposition: Discharge disposition: 01-Home or Self Care       Discharge Instructions    Discharge patient   Complete by: As directed    Discharge disposition: 01-Home or Self Care   Discharge patient date: 11/28/2019     Allergies as of 11/28/2019   No Known Allergies     Medication List    TAKE these medications   carvedilol 3.125 MG tablet Commonly known as: COREG Take 1 tablet by mouth 2 (two) times daily.   doxycycline 100 MG tablet Commonly known as: VIBRA-TABS Take 100 mg by mouth 2 (two) times daily.   GoodSense Aspirin 325 MG tablet Generic drug: aspirin Take 325 mg by mouth daily.   HYDROcodone-acetaminophen 10-325 MG tablet Commonly known as: NORCO Take 1 tablet by mouth every 4 (four) hours as needed for moderate pain. Maximum dose per 24 hours - 8 pills   isosorbide  mononitrate 30 MG 24 hr tablet Commonly known as: IMDUR TAKE 1 TABLET BY MOUTH EVERY DAY   Lipitor 40 MG tablet Generic drug: atorvastatin Take 40 mg by mouth daily.   metFORMIN 850 MG tablet Commonly known as: GLUCOPHAGE Take 1 tablet by mouth 2 (two) times daily with a meal.   nitroGLYCERIN 0.4 MG SL tablet Commonly known as: NITROSTAT Place 1 tablet (0.4 mg total) under the tongue every 5 (five) minutes as needed for chest pain.   omeprazole 20 MG capsule Commonly known as: PRILOSEC Take 20 mg by mouth daily.   phenazopyridine 200 MG tablet Commonly known as: Pyridium Take 1 tablet (200 mg total) by mouth 3 (three) times daily as needed for pain.   tamsulosin 0.4 MG Caps capsule Commonly known as: FLOMAX Take 0.4 mg by mouth daily.      Follow-up Information    [facility] On 12/11/2019.   Why: For your appiontment at 8:15 Contact information: [address] [place] [address] [place] [phone]          Signed: [phone] 11/28/2019, 7:14 AM

## 2019-12-11 DIAGNOSIS — N401 Enlarged prostate with lower urinary tract symptoms: Secondary | ICD-10-CM | POA: Diagnosis not present

## 2019-12-11 DIAGNOSIS — R338 Other retention of urine: Secondary | ICD-10-CM | POA: Diagnosis not present

## 2020-01-26 DIAGNOSIS — I209 Angina pectoris, unspecified: Secondary | ICD-10-CM | POA: Diagnosis not present

## 2020-01-26 DIAGNOSIS — N400 Enlarged prostate without lower urinary tract symptoms: Secondary | ICD-10-CM | POA: Diagnosis not present

## 2020-01-26 DIAGNOSIS — E782 Mixed hyperlipidemia: Secondary | ICD-10-CM | POA: Diagnosis not present

## 2020-01-26 DIAGNOSIS — Z0000 Encounter for general adult medical examination without abnormal findings: Secondary | ICD-10-CM | POA: Diagnosis not present

## 2020-01-26 DIAGNOSIS — E1169 Type 2 diabetes mellitus with other specified complication: Secondary | ICD-10-CM | POA: Diagnosis not present

## 2020-01-26 DIAGNOSIS — J449 Chronic obstructive pulmonary disease, unspecified: Secondary | ICD-10-CM | POA: Diagnosis not present

## 2020-01-26 DIAGNOSIS — I25110 Atherosclerotic heart disease of native coronary artery with unstable angina pectoris: Secondary | ICD-10-CM | POA: Diagnosis not present

## 2020-01-26 DIAGNOSIS — G14 Postpolio syndrome: Secondary | ICD-10-CM | POA: Diagnosis not present

## 2020-01-26 DIAGNOSIS — I119 Hypertensive heart disease without heart failure: Secondary | ICD-10-CM | POA: Diagnosis not present

## 2020-03-11 DIAGNOSIS — N5201 Erectile dysfunction due to arterial insufficiency: Secondary | ICD-10-CM | POA: Diagnosis not present

## 2020-03-11 DIAGNOSIS — R351 Nocturia: Secondary | ICD-10-CM | POA: Diagnosis not present

## 2020-03-11 DIAGNOSIS — B029 Zoster without complications: Secondary | ICD-10-CM | POA: Diagnosis not present

## 2020-03-11 DIAGNOSIS — R338 Other retention of urine: Secondary | ICD-10-CM | POA: Diagnosis not present

## 2020-03-11 DIAGNOSIS — N401 Enlarged prostate with lower urinary tract symptoms: Secondary | ICD-10-CM | POA: Diagnosis not present

## 2020-03-12 DIAGNOSIS — E119 Type 2 diabetes mellitus without complications: Secondary | ICD-10-CM | POA: Diagnosis not present

## 2020-03-12 DIAGNOSIS — B0239 Other herpes zoster eye disease: Secondary | ICD-10-CM | POA: Diagnosis not present

## 2020-04-23 DIAGNOSIS — H5203 Hypermetropia, bilateral: Secondary | ICD-10-CM | POA: Diagnosis not present

## 2020-04-23 DIAGNOSIS — H524 Presbyopia: Secondary | ICD-10-CM | POA: Diagnosis not present

## 2020-04-23 DIAGNOSIS — H52223 Regular astigmatism, bilateral: Secondary | ICD-10-CM | POA: Diagnosis not present

## 2020-04-23 DIAGNOSIS — Z7984 Long term (current) use of oral hypoglycemic drugs: Secondary | ICD-10-CM | POA: Diagnosis not present

## 2020-04-23 DIAGNOSIS — E119 Type 2 diabetes mellitus without complications: Secondary | ICD-10-CM | POA: Diagnosis not present

## 2020-04-23 DIAGNOSIS — H2513 Age-related nuclear cataract, bilateral: Secondary | ICD-10-CM | POA: Diagnosis not present

## 2020-05-14 DIAGNOSIS — A809 Acute poliomyelitis, unspecified: Secondary | ICD-10-CM | POA: Insufficient documentation

## 2020-05-14 DIAGNOSIS — I119 Hypertensive heart disease without heart failure: Secondary | ICD-10-CM | POA: Insufficient documentation

## 2020-05-14 DIAGNOSIS — G14 Postpolio syndrome: Secondary | ICD-10-CM

## 2020-05-14 DIAGNOSIS — I209 Angina pectoris, unspecified: Secondary | ICD-10-CM

## 2020-05-14 DIAGNOSIS — H9190 Unspecified hearing loss, unspecified ear: Secondary | ICD-10-CM

## 2020-05-14 DIAGNOSIS — I10 Essential (primary) hypertension: Secondary | ICD-10-CM | POA: Insufficient documentation

## 2020-05-14 DIAGNOSIS — N400 Enlarged prostate without lower urinary tract symptoms: Secondary | ICD-10-CM | POA: Insufficient documentation

## 2020-05-14 HISTORY — DX: Hypertensive heart disease without heart failure: I11.9

## 2020-05-14 HISTORY — DX: Angina pectoris, unspecified: I20.9

## 2020-05-14 HISTORY — DX: Unspecified hearing loss, unspecified ear: H91.90

## 2020-05-14 HISTORY — DX: Postpolio syndrome: G14

## 2020-05-14 HISTORY — DX: Benign prostatic hyperplasia without lower urinary tract symptoms: N40.0

## 2020-05-15 ENCOUNTER — Encounter: Payer: Self-pay | Admitting: Cardiology

## 2020-05-15 ENCOUNTER — Ambulatory Visit: Payer: PPO | Admitting: Cardiology

## 2020-05-15 ENCOUNTER — Other Ambulatory Visit: Payer: Self-pay

## 2020-05-15 VITALS — BP 145/84 | HR 64 | Ht 70.0 in | Wt 155.0 lb

## 2020-05-15 DIAGNOSIS — Z7189 Other specified counseling: Secondary | ICD-10-CM | POA: Diagnosis not present

## 2020-05-15 DIAGNOSIS — I10 Essential (primary) hypertension: Secondary | ICD-10-CM | POA: Diagnosis not present

## 2020-05-15 DIAGNOSIS — E785 Hyperlipidemia, unspecified: Secondary | ICD-10-CM | POA: Diagnosis not present

## 2020-05-15 DIAGNOSIS — I2510 Atherosclerotic heart disease of native coronary artery without angina pectoris: Secondary | ICD-10-CM

## 2020-05-15 MED ORDER — ASPIRIN EC 81 MG PO TBEC: 81.0000 mg | DELAYED_RELEASE_TABLET | Freq: Every day | ORAL | 3 refills | Status: DC

## 2020-05-15 NOTE — Patient Instructions (Signed)
Medication Instructions:  Your physician has recommended you make the following change in your medication:  STOP: Aspirin 325 START: Aspirin 81 mg take one tablet by mouth daily.  *If you need a refill on your cardiac medications before your next appointment, please call your pharmacy*   Lab Work: None If you have labs (blood work) drawn today and your tests are completely normal, you will receive your results only by: Marland Kitchen MyChart Message (if you have MyChart) OR . A paper copy in the mail If you have any lab test that is abnormal or we need to change your treatment, we will call you to review the results.   Testing/Procedures: None   Follow-Up: At [facility], you and your health needs are our priority.  As part of our continuing mission to provide you with exceptional heart care, we have created designated Provider Care Teams.  These Care Teams include your primary Cardiologist (physician) and Advanced Practice Providers (APPs -  Physician Assistants and Nurse Practitioners) who all work together to provide you with the care you need, when you need it.  We recommend signing up for the patient portal called "MyChart".  Sign up information is provided on this After Visit Summary.  MyChart is used to connect with patients for Virtual Visits (Telemedicine).  Patients are able to view lab/test results, encounter notes, upcoming appointments, etc.  Non-urgent messages can be sent to your provider as well.   To learn more about what you can do with MyChart, go to [email or web address].    Your next appointment:   6 month(s)  The format for your next appointment:   In Person  Provider:   Norman Herrlich, MD   Other Instructions

## 2020-05-15 NOTE — Progress Notes (Signed)
Cardiology Office Note:    Date:  05/15/2020   ID:  Edward Russo, DOB 02-14-1946, MRN [id number]  PCP:  [name], MD  Cardiologist:  Norman Herrlich, MD    Referring MD: [name], MD    ASSESSMENT:    1. Coronary artery disease involving native coronary artery of native heart, angina presence unspecified   2. Hypertension, unspecified type   3. Hyperlipidemia LDL goal <100   4. Educated about COVID-19 virus infection    PLAN:    In order of problems listed above:  1. Stable managed on current medical treatment continue with regiment reduce aspirin 81 mg daily beta-blocker oral nitrate and high intensity statin. 2. BP at target continue current treatment 3. Tolerates statin lipids at target continue atorvastatin 4. Advised booster when available discussed postexposure prophylaxis if needed advised to use KN 95 mask   Next appointment: 6 months   Medication Adjustments/Labs and Tests Ordered: Current medicines are reviewed at length with the patient today.  Concerns regarding medicines are outlined above.  Orders Placed This Encounter  Procedures   EKG 12-Lead   Meds ordered this encounter  Medications   aspirin EC 81 MG tablet    Sig: Take 1 tablet (81 mg total) by mouth daily. Swallow whole.    Dispense:  90 tablet    Refill:  3    Chief Complaint  Patient presents with   Follow-up   Coronary Artery Disease    History of Present Illness:    Edward Russo is a 74 y.o. 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 Compliance with diet, lifestyle and medications: Yes  MPI 05/05/2019: Study Highlights   The left ventricular ejection fraction is normal (55-65%).  Nuclear stress EF: 59%. No wall motion abnormalities  There was no ST segment deviation noted during stress.  Defect 1: There is a medium defect of moderate severity present in the basal inferolateral  and mid inferolateral location.  Findings consistent with prior myocardial infarction with peri-infarct ischemia in the base to mid inferolateral wall segment.  This is an intermediate risk study.  Prior CABG   Patient's wife is present participate in evaluation and decision making.  They have good healthcare literacy has had COVID-19 immunization practices 3 WS and I discussed with him post exposure prophylaxis with monoclonal antibody for close contact with COVID-19.  Having trouble with medical mask I advised him to start using KN 95.  He has done well no angina dyspnea palpitations syncope can tolerate statin without muscle pain or weakness he takes high-dose aspirin will reduce to 81 mg daily labs from his primary care physician's office most recently shows lipids at target. 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    carvedilol (COREG) 3.125 MG tablet Take 1 tablet by mouth 2 (two) times daily.    doxycycline (VIBRA-TABS) 100 MG tablet Take 100 mg by mouth 2 (two) times daily.   HYDROcodone-acetaminophen (NORCO) 10-325 MG tablet Take 1 tablet by mouth every 4 (four) hours as needed for moderate pain. Maximum dose per 24 hours - 8 pills   isosorbide mononitrate (IMDUR) 30 MG 24 hr tablet TAKE 1 TABLET BY MOUTH EVERY DAY   metFORMIN (GLUCOPHAGE) 850 MG tablet Take 1 tablet by mouth 2 (two) times daily with a meal.    nitroGLYCERIN (NITROSTAT) 0.4 MG SL tablet Place 1 tablet (0.4 mg total) under the tongue every 5 (five) minutes as needed for chest pain.   omeprazole (PRILOSEC) 20 MG capsule Take 20 mg by mouth daily.   [DISCONTINUED] aspirin (GOODSENSE ASPIRIN) 325 MG tablet Take 325 mg by mouth daily.     Allergies:   Lisinopril   Social History   Socioeconomic History   Marital status: Married    Spouse name: Not on file   Number of children: Not on file   Years of education: Not on file   Highest education level: Not on file  Occupational History   Not on file  Tobacco Use   Smoking status: Never Smoker   Smokeless tobacco: Never Used  Vaping Use   Vaping Use: Never used  Substance and Sexual Activity   Alcohol use: No   Drug use: No   Sexual activity: Not on file  Other Topics Concern   Not on file  Social History Narrative   Not on file   Social Determinants of Health   Financial Resource Strain:    Difficulty of Paying Living Expenses: Not on file  Food Insecurity:    Worried About Running Out of Food in the Last Year: Not on file   Ran Out of Food in the Last Year: Not on file  Transportation Needs:    Lack of Transportation (Medical): Not on file   Lack of Transportation (Non-Medical): Not on file  Physical Activity:    Days of Exercise per Week: Not on file   Minutes of Exercise  per Session: Not on file  Stress:    Feeling of Stress : Not on file  Social Connections:    Frequency of Communication with Friends and Family: Not on file   Frequency of Social Gatherings with Friends and Family: Not on file   Attends Religious Services: Not on file   Active Member of Clubs or Organizations: Not on file   Attends Banker Meetings: Not on file   Marital Status: Not on file     Family History: The patient's family history includes Asthma in his mother; CAD in his father; Heart attack in his brother; Heart failure in his mother. ROS:   Please see the history of present illness.    All other systems reviewed and are negative.  EKGs/Labs/Other Studies Reviewed:    The following studies  were reviewed today:  EKG:  EKG ordered today and personally reviewed.  The ekg ordered today demonstrates sinus rhythm and is normal  Recent labs 01/26/2020 from Dr. Jabier Mutton: Cholesterol 114, LDL 61 triglycerides 98, HDL 35 lipids are at target A1c at target 6.9%  Recent Labs: 08/14/2019: ALT 22; Platelets 234 11/27/2019: BUN 18; Creatinine, Ser 0.90; Hemoglobin 13.3; Potassium 3.7; Sodium 143  Recent Lipid Panel    Component Value Date/Time   CHOL 109 05/09/2019 0950   TRIG 94 05/09/2019 0950   HDL 34 (L) 05/09/2019 0950   CHOLHDL 3.2 05/09/2019 0950   CHOLHDL 8.0 10/27/2009 0012   VLDL 36 10/27/2009 0012   LDLCALC 57 05/09/2019 0950    Physical Exam:    VS:  BP (!) 145/84    Pulse 64    Ht 5\' 10"  (1.778 m)    Wt 155 lb (70.3 kg)    SpO2 96%    BMI 22.24 kg/m     Wt Readings from Last 3 Encounters:  05/15/20 155 lb (70.3 kg)  11/27/19 176 lb (79.8 kg)  11/08/19 164 lb (74.4 kg)     GEN:  Well nourished, well developed in no acute distress HEENT: Normal NECK: No JVD; No carotid bruits LYMPHATICS: No lymphadenopathy CARDIAC: Morbid RRR, no murmurs, rubs, gallops RESPIRATORY:  Clear to auscultation without rales, wheezing or rhonchi  ABDOMEN:  Soft, non-tender, non-distended MUSCULOSKELETAL:  No edema; No deformity  SKIN: Warm and dry NEUROLOGIC:  Alert and oriented x 3 PSYCHIATRIC:  Normal affect    Signed, 01/08/20, MD  05/15/2020 9:46 AM    [facility]

## 2020-05-27 ENCOUNTER — Other Ambulatory Visit: Payer: Self-pay

## 2020-05-27 MED ORDER — ISOSORBIDE MONONITRATE ER 30 MG PO TB24: 30.0000 mg | ORAL_TABLET | Freq: Every day | ORAL | 4 refills | Status: DC

## 2020-07-15 DIAGNOSIS — J449 Chronic obstructive pulmonary disease, unspecified: Secondary | ICD-10-CM | POA: Diagnosis not present

## 2020-07-15 DIAGNOSIS — Z23 Encounter for immunization: Secondary | ICD-10-CM | POA: Diagnosis not present

## 2020-07-15 DIAGNOSIS — E782 Mixed hyperlipidemia: Secondary | ICD-10-CM | POA: Diagnosis not present

## 2020-07-15 DIAGNOSIS — E1169 Type 2 diabetes mellitus with other specified complication: Secondary | ICD-10-CM | POA: Diagnosis not present

## 2020-07-15 DIAGNOSIS — I25110 Atherosclerotic heart disease of native coronary artery with unstable angina pectoris: Secondary | ICD-10-CM | POA: Diagnosis not present

## 2020-07-15 DIAGNOSIS — I119 Hypertensive heart disease without heart failure: Secondary | ICD-10-CM | POA: Diagnosis not present

## 2020-10-30 NOTE — Progress Notes (Signed)
Cardiology Office Note:    Date:  10/31/2020   ID:  Edward Russo, DOB 23-Nov-1945, MRN [id number]  PCP:  [name], MD  Cardiologist:  Norman Herrlich, MD    Referring MD: [name], MD    ASSESSMENT:    1. Coronary artery disease of native artery of native heart with stable angina pectoris (HCC)   2. Hyperlipidemia LDL goal <100   3. Essential hypertension   4. Educated about COVID-19 virus infection    PLAN:    In order of problems listed above:  1. Stable CAD following surgical revascularization he is having no angina on current medical therapy we will continue aspirin beta-blocker oral nitrate and high intensity statin at this time I would not advise further ischemic evaluation.  If having typical angina coronary angiography would be appropriate.  He lives closer to [place] and I have referred him to our group there I told him he did receive high quality of care 2. Ideal lipids continue his high intensity statin with CAD 3. BP at target continue beta-blocker 4. Strongly encouraged to complete his vaccination with a third dose MRA vaccine   Next appointment: As needed he will follow up with [facility] [place]   Medication Adjustments/Labs and Tests Ordered: Current medicines are reviewed at length with the patient today.  Concerns regarding medicines are outlined above.  No orders of the defined types were placed in this encounter.  Meds ordered this encounter  Medications  . nitroGLYCERIN (NITROSTAT) 0.4 MG SL tablet    Sig: Place 1 tablet (0.4 mg total) under the tongue every 5 (five) minutes as needed for chest pain.    Dispense:  25 tablet    Refill:  11    Chief Complaint  Patient presents with  . Follow-up  . Coronary Artery Disease    History of Present Illness:    Edward Russo is a 75 y.o. male with a hx of CAD bypass surgery 1996 dyslipidemia hypertension and COPD.  He was last seen 05/25/2020. In September 2020 had a myocardial  perfusion study performed showing findings of prior myocardial infarction with very mild peri-infarct ischemia. Compliance with diet, lifestyle and medications: Yes  He does not have typical angina has not needed nitroglycerin but at times he gets momentary vague chest discomfort not severe frequent or sustained. Quality of life is good tolerates his cardiac medication without muscle pain or weakness and has had no edema shortness of breath orthopnea palpitation or syncope. Despite good healthcare literacy he has not gotten his booster and I told him he needs to be fully vaccinated and strongly encouraged him to get a third dose as quickly as possible. Past Medical History:  Diagnosis Date  . Angina pectoris (HCC) 05/14/2020  . Aortic ectasia, thoracic (HCC) 06/12/2015  . BPH with urinary obstruction 11/27/2019  . COPD (chronic obstructive pulmonary disease) (HCC) 10/04/2017  . Coronary artery disease involving native coronary artery of native heart 06/12/2015  . Enlarged prostate 05/14/2020  . Esophageal reflux 10/04/2017  . Essential hypertension 06/12/2015  . Hearing loss 05/14/2020  . Hyperlipidemia LDL goal <100 06/12/2015  . Hypertension   . Hypertensive heart disease without congestive heart failure 05/14/2020  . Polio   . 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 Type 2 diabetes mellitus (HCC) 10/04/2017    Past Surgical History:  Procedure Laterality Date  . BACK SURGERY     lower  . CARDIAC CATHETERIZATION  2011   patent LTA and RTA  at cath in 2011   . CORONARY ARTERY BYPASS GRAFT  1996  . TRANSURETHRAL RESECTION OF PROSTATE N/A 11/27/2019   Procedure: TRANSURETHRAL RESECTION OF THE PROSTATE (TURP);  Surgeon: Ihor Gully, MD;  Location: [facility];  Service: Urology;  Laterality: N/A;    Current Medications: Current Meds  Medication Sig  . aspirin EC 81 MG tablet Take 1 tablet (81 mg total) by mouth daily. Swallow whole.  Marland Kitchen atorvastatin (LIPITOR) 40 MG tablet Take 40 mg  by mouth daily.   . carvedilol (COREG) 3.125 MG tablet Take 1 tablet by mouth 2 (two) times daily.   . isosorbide mononitrate (IMDUR) 30 MG 24 hr tablet Take 1 tablet (30 mg total) by mouth daily.  . metFORMIN (GLUCOPHAGE) 850 MG tablet Take 1 tablet by mouth 2 (two) times daily with a meal.   . omeprazole (PRILOSEC) 20 MG capsule Take 20 mg by mouth daily.  . [DISCONTINUED] nitroGLYCERIN (NITROSTAT) 0.4 MG SL tablet Place 1 tablet (0.4 mg total) under the tongue every 5 (five) minutes as needed for chest pain.     Allergies:   Lisinopril   Social History   Socioeconomic History  . Marital status: Married    Spouse name: Not on file  . Number of children: Not on file  . Years of education: Not on file  . Highest education level: Not on file  Occupational History  . Not on file  Tobacco Use  . Smoking status: Never Smoker  . Smokeless tobacco: Never Used  Vaping Use  . Vaping Use: Never used  Substance and Sexual Activity  . Alcohol use: No  . Drug use: No  . Sexual activity: Not on file  Other Topics Concern  . Not on file  Social History Narrative  . Not on file   Social Determinants of Health   Financial Resource Strain: Not on file  Food Insecurity: Not on file  Transportation Needs: Not on file  Physical Activity: Not on file  Stress: Not on file  Social Connections: Not on file     Family History: The patient's family history includes Asthma in his mother; CAD in his father; Heart attack in his brother; Heart failure in his mother. ROS:   Please see the history of present illness.    All other systems reviewed and are negative.  EKGs/Labs/Other Studies Reviewed:    The following studies were reviewed today:    Recent Labs:  07/15/2020 lipids are ideal LDL 69 cholesterol 121 HDL 33 triglycerides 105 A1c at target 6.5% and normal creatinine. 11/27/2019: BUN 18; Creatinine, Ser 0.90; Hemoglobin 13.3; Potassium 3.7; Sodium 143  Recent Lipid Panel     Component Value Date/Time   CHOL 109 05/09/2019 0950   TRIG 94 05/09/2019 0950   HDL 34 (L) 05/09/2019 0950   CHOLHDL 3.2 05/09/2019 0950   CHOLHDL 8.0 10/27/2009 0012   VLDL 36 10/27/2009 0012   LDLCALC 57 05/09/2019 0950    Physical Exam:    VS:  BP 136/80   Pulse (!) 56   Ht 5\' 10"  (1.778 m)   Wt 162 lb 1.3 oz (73.5 kg)   SpO2 97%   BMI 23.26 kg/m     Wt Readings from Last 3 Encounters:  10/31/20 162 lb 1.3 oz (73.5 kg)  05/15/20 155 lb (70.3 kg)  11/27/19 176 lb (79.8 kg)     GEN:  Well nourished, well developed in no acute distress HEENT: Normal NECK: No JVD; No carotid  bruits LYMPHATICS: No lymphadenopathy CARDIAC: RRR, no murmurs, rubs, gallops RESPIRATORY:  Clear to auscultation without rales, wheezing or rhonchi  ABDOMEN: Soft, non-tender, non-distended MUSCULOSKELETAL:  No edema; No deformity  SKIN: Warm and dry NEUROLOGIC:  Alert and oriented x 3 PSYCHIATRIC:  Normal affect    Signed, Norman Herrlich, MD  10/31/2020 10:26 AM    [facility]

## 2020-10-31 ENCOUNTER — Other Ambulatory Visit: Payer: Self-pay

## 2020-10-31 ENCOUNTER — Encounter: Payer: Self-pay | Admitting: Cardiology

## 2020-10-31 ENCOUNTER — Ambulatory Visit: Payer: PPO | Admitting: Cardiology

## 2020-10-31 VITALS — BP 136/80 | HR 56 | Ht 70.0 in | Wt 162.1 lb

## 2020-10-31 DIAGNOSIS — Z7189 Other specified counseling: Secondary | ICD-10-CM

## 2020-10-31 DIAGNOSIS — I10 Essential (primary) hypertension: Secondary | ICD-10-CM

## 2020-10-31 DIAGNOSIS — E785 Hyperlipidemia, unspecified: Secondary | ICD-10-CM

## 2020-10-31 DIAGNOSIS — I25118 Atherosclerotic heart disease of native coronary artery with other forms of angina pectoris: Secondary | ICD-10-CM

## 2020-10-31 MED ORDER — NITROGLYCERIN 0.4 MG SL SUBL: 0.4000 mg | SUBLINGUAL_TABLET | SUBLINGUAL | 11 refills | Status: AC | PRN

## 2020-10-31 NOTE — Patient Instructions (Signed)
Medication Instructions:  No medication changes. *If you need a refill on your cardiac medications before your next appointment, please call your pharmacy*   Lab Work: None ordered If you have labs (blood work) drawn today and your tests are completely normal, you will receive your results only by: Marland Kitchen MyChart Message (if you have MyChart) OR . A paper copy in the mail If you have any lab test that is abnormal or we need to change your treatment, we will call you to review the results.   Testing/Procedures: None ordered   Follow-Up: At [facility], you and your health needs are our priority.  As part of our continuing mission to provide you with exceptional heart care, we have created designated Provider Care Teams.  These Care Teams include your primary Cardiologist (physician) and Advanced Practice Providers (APPs -  Physician Assistants and Nurse Practitioners) who all work together to provide you with the care you need, when you need it.  We recommend signing up for the patient portal called "MyChart".  Sign up information is provided on this After Visit Summary.  MyChart is used to connect with patients for Virtual Visits (Telemedicine).  Patients are able to view lab/test results, encounter notes, upcoming appointments, etc.  Non-urgent messages can be sent to your provider as well.   To learn more about what you can do with MyChart, go to [email or web address].    Your next appointment:   6 month(s)  The format for your next appointment:   In Person  Provider:   Debbe Odea, MD   Other Instructions NA

## 2021-01-30 DIAGNOSIS — I25110 Atherosclerotic heart disease of native coronary artery with unstable angina pectoris: Secondary | ICD-10-CM | POA: Diagnosis not present

## 2021-01-30 DIAGNOSIS — J449 Chronic obstructive pulmonary disease, unspecified: Secondary | ICD-10-CM | POA: Diagnosis not present

## 2021-01-30 DIAGNOSIS — N400 Enlarged prostate without lower urinary tract symptoms: Secondary | ICD-10-CM | POA: Diagnosis not present

## 2021-01-30 DIAGNOSIS — I209 Angina pectoris, unspecified: Secondary | ICD-10-CM | POA: Diagnosis not present

## 2021-01-30 DIAGNOSIS — Z0000 Encounter for general adult medical examination without abnormal findings: Secondary | ICD-10-CM | POA: Diagnosis not present

## 2021-01-30 DIAGNOSIS — E782 Mixed hyperlipidemia: Secondary | ICD-10-CM | POA: Diagnosis not present

## 2021-01-30 DIAGNOSIS — E1169 Type 2 diabetes mellitus with other specified complication: Secondary | ICD-10-CM | POA: Diagnosis not present

## 2021-01-30 DIAGNOSIS — G14 Postpolio syndrome: Secondary | ICD-10-CM | POA: Diagnosis not present

## 2021-01-30 DIAGNOSIS — I119 Hypertensive heart disease without heart failure: Secondary | ICD-10-CM | POA: Diagnosis not present

## 2021-03-17 ENCOUNTER — Other Ambulatory Visit: Payer: Self-pay

## 2021-03-17 ENCOUNTER — Telehealth (INDEPENDENT_AMBULATORY_CARE_PROVIDER_SITE_OTHER): Payer: Self-pay | Admitting: Gastroenterology

## 2021-03-17 DIAGNOSIS — Z1211 Encounter for screening for malignant neoplasm of colon: Secondary | ICD-10-CM

## 2021-03-17 MED ORDER — NA SULFATE-K SULFATE-MG SULF 17.5-3.13-1.6 GM/177ML PO SOLN: 1.0000 | Freq: Once | ORAL | 0 refills | Status: AC

## 2021-03-17 NOTE — Progress Notes (Signed)
Gastroenterology Pre-Procedure Review  Request Date: 04/01/21 Requesting Physician: Dr. Vicente Males  PATIENT REVIEW QUESTIONS: The patient responded to the following health history questions as indicated:    1. Are you having any GI issues? no 2. Do you have a personal history of Polyps?  2012 no polyps removed. 3. Do you have a family history of Colon Cancer or Polyps? no 4. Diabetes Mellitus? no 5. Joint replacements in the past 12 months?no 6. Major health problems in the past 3 months?no 7. Any artificial heart valves, MVP, or defibrillator?no    MEDICATIONS & ALLERGIES:    Patient reports the following regarding taking any anticoagulation/antiplatelet therapy:   Plavix, Coumadin, Eliquis, Xarelto, Lovenox, Pradaxa, Brilinta, or Effient? no Aspirin? yes (81 mg)  Patient confirms/reports the following medications:  Current Outpatient Medications  Medication Sig Dispense Refill   Na Sulfate-K Sulfate-Mg Sulf 17.5-3.13-1.6 GM/177ML SOLN Take 1 kit by mouth once for 1 dose. 354 mL 0   aspirin EC 81 MG tablet Take 1 tablet (81 mg total) by mouth daily. Swallow whole. 90 tablet 3   atorvastatin (LIPITOR) 40 MG tablet Take 40 mg by mouth daily.      carvedilol (COREG) 3.125 MG tablet Take 1 tablet by mouth 2 (two) times daily.      isosorbide mononitrate (IMDUR) 30 MG 24 hr tablet Take 1 tablet (30 mg total) by mouth daily. 90 tablet 4   metFORMIN (GLUCOPHAGE) 850 MG tablet Take 1 tablet by mouth 2 (two) times daily with a meal.      nitroGLYCERIN (NITROSTAT) 0.4 MG SL tablet Place 1 tablet (0.4 mg total) under the tongue every 5 (five) minutes as needed for chest pain. 25 tablet 11   omeprazole (PRILOSEC) 20 MG capsule Take 20 mg by mouth daily.     No current facility-administered medications for this visit.    Patient confirms/reports the following allergies:  Allergies  Allergen Reactions   Lisinopril Cough    No orders of the defined types were placed in this  encounter.   AUTHORIZATION INFORMATION Primary Insurance: 1D#: Group #:  Secondary Insurance: 1D#: Group #:  SCHEDULE INFORMATION: Date: 04/01/21 Time: Location: [place]

## 2021-03-19 ENCOUNTER — Telehealth: Payer: Self-pay

## 2021-03-19 NOTE — Telephone Encounter (Signed)
   [facility] Pre-operative Risk Assessment    Patient Name: MELANIE PELLOT  DOB: April 02, 1946 MRN: [id number]  HEARTCARE STAFF:  - IMPORTANT!!!!!! Under Visit Info/Reason for Call, type in Other and utilize the format Clearance MM/DD/YY or Clearance TBD. Do not use dashes or single digits. - Please review there is not already an duplicate clearance open for this procedure. - If request is for dental extraction, please clarify the # of teeth to be extracted. - If the patient is currently at the dentist's office, call Pre-Op Callback Staff (MA/nurse) to input urgent request.  - If the patient is not currently in the dentist office, please route to the Pre-Op pool.  Request for surgical clearance:  What type of surgery is being performed? Colonoscopy  When is this surgery scheduled?  04/01/2021  What type of clearance is required (medical clearance vs. Pharmacy clearance to hold med vs. Both)? Both  Are there any medications that need to be held prior to surgery and how long? None noted  Practice name and name of physician performing surgery? [facility] Dr. Vicente Males.   What is the office phone number? [phone]   7.   What is the office fax number? [phone]  8.   Anesthesia type (None, local, MAC, general) ? General   Gita Kudo 03/19/2021, 11:33 AM  _________________________________________________________________   (provider comments below)

## 2021-03-21 NOTE — Telephone Encounter (Signed)
   Patient Name: Edward Russo  DOB: February 03, 1946 MRN: [id number]  Primary Cardiologist: Norman Herrlich, MD  Chart reviewed as part of pre-operative protocol coverage for colonoscopy 04/01/2021.   Patient has history of CAD, hypertension, and hyperlipidemia.  He was last seen in office 10/31/2020 by his primary cardiologist, Dr. [name].  At that time, he denied any anginal pain or concerning symptoms. He reported vague CP that was not severe or sustained. He was continued on his current medications.  It was noted that, it he started to experience typical angina, left heart cath with coronary angiography would be appropriate.  He was referred to the [place] office.  COVID vaccination booster was encouraged. He has not had any change or new symptoms since that time.  Per the preop request form, gastroenterology is NOT intending to hold ASA on this patient.   Based on that the pt is not going to hold his ASA and has not had any new sx since the above visit, he would be at acceptable risk for the planned procedure without further cardiovascular testing.   I will route this recommendation to the requesting party via Epic fax function and remove from pre-op pool. Please call with questions.  Lennon Alstrom, PA-C 03/21/2021, 12:52 PM

## 2021-03-25 ENCOUNTER — Telehealth: Payer: Self-pay

## 2021-03-25 NOTE — Telephone Encounter (Signed)
Called the endoscopy unit to let them know that the patient wants to cancel at this time.

## 2021-03-25 NOTE — Telephone Encounter (Signed)
Patient wants to cancel his colonoscopy 04/01/2021 because he states he has not had a EKG in over a year and wants to have that done first before having a colonoscopy. He will call back and reschedule the procedure.

## 2021-04-01 ENCOUNTER — Encounter: Admission: RE | Payer: Self-pay | Source: Home / Self Care

## 2021-04-01 ENCOUNTER — Ambulatory Visit: Admission: RE | Admit: 2021-04-01 | Payer: PPO | Source: Home / Self Care | Admitting: Gastroenterology

## 2021-04-01 SURGERY — COLONOSCOPY WITH PROPOFOL
Anesthesia: General

## 2021-04-30 DIAGNOSIS — H52223 Regular astigmatism, bilateral: Secondary | ICD-10-CM | POA: Diagnosis not present

## 2021-04-30 DIAGNOSIS — Z7984 Long term (current) use of oral hypoglycemic drugs: Secondary | ICD-10-CM | POA: Diagnosis not present

## 2021-04-30 DIAGNOSIS — H2513 Age-related nuclear cataract, bilateral: Secondary | ICD-10-CM | POA: Diagnosis not present

## 2021-04-30 DIAGNOSIS — E119 Type 2 diabetes mellitus without complications: Secondary | ICD-10-CM | POA: Diagnosis not present

## 2021-04-30 DIAGNOSIS — H5203 Hypermetropia, bilateral: Secondary | ICD-10-CM | POA: Diagnosis not present

## 2021-04-30 DIAGNOSIS — H524 Presbyopia: Secondary | ICD-10-CM | POA: Diagnosis not present

## 2021-05-08 ENCOUNTER — Encounter: Payer: Self-pay | Admitting: Cardiology

## 2021-05-08 ENCOUNTER — Other Ambulatory Visit: Payer: Self-pay | Admitting: Cardiology

## 2021-05-08 ENCOUNTER — Ambulatory Visit: Payer: PPO | Admitting: Cardiology

## 2021-05-08 ENCOUNTER — Other Ambulatory Visit: Payer: Self-pay

## 2021-05-08 VITALS — BP 120/74 | HR 57 | Ht 70.0 in | Wt 158.0 lb

## 2021-05-08 DIAGNOSIS — I2510 Atherosclerotic heart disease of native coronary artery without angina pectoris: Secondary | ICD-10-CM

## 2021-05-08 DIAGNOSIS — E7800 Pure hypercholesterolemia, unspecified: Secondary | ICD-10-CM | POA: Diagnosis not present

## 2021-05-08 DIAGNOSIS — I10 Essential (primary) hypertension: Secondary | ICD-10-CM

## 2021-05-08 NOTE — Progress Notes (Signed)
Cardiology Office Note:    Date:  05/08/2021   ID:  Edward Russo, DOB [birth year], MRN [id number]  PCP:  [name], MD   [facility] Providers Cardiologist:  Norman Herrlich, MD     Referring MD: [name], MD   Chief Complaint  Patient presents with   Other    6 month follow up. Patient c.o some dull pain in chest and  SOB in the heat. Meds reviewed verbally with patient.     History of Present Illness:    Edward Russo is a 75 y.o. male with a hx of CAD/CABG x 2 in 1996, hypertension, hyperlipidemia, COPD presenting for follow-up.  Patient last seen at heart care [place] office, wish to switch to [facility] for easier commute.  He has a history of stable angina managed with beta-blocker and Imdur.  Had 1 episode of chest discomfort lasting a few minutes 4 days ago.  Has not had any further episodes since.  Symptoms occurred when he was sitting.  Has some shortness of breath with exertion which was worsened with the summer heat wave.  Has been told he has COPD, he denies any history of prior smoking.  His blood pressures have been well controlled on current medicine.  He has not needed use of sublingual nitroglycerin.  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    carvedilol (COREG) 3.125 MG tablet Take 1 tablet by mouth 2 (two) times daily.    isosorbide mononitrate (IMDUR) 30 MG 24 hr tablet Take 1 tablet (30 mg total) by mouth daily.   metFORMIN (GLUCOPHAGE) 850 MG tablet Take 1 tablet by mouth 2 (two) times daily with a meal.    nitroGLYCERIN (NITROSTAT) 0.4 MG SL tablet Place 1 tablet (0.4 mg total) under the tongue every 5 (five) minutes as needed for chest pain.   omeprazole (PRILOSEC) 20 MG capsule Take 20 mg by mouth daily.   [DISCONTINUED] aspirin EC 81 MG tablet Take 1 tablet (81 mg total) by mouth daily. Swallow whole.     Allergies:   Lisinopril   Social History   Socioeconomic History   Marital status: Married    Spouse name: Not on file   Number of children: Not on file   Years of education: Not on file   Highest education level: Not on  file  Occupational History   Not on file  Tobacco Use   Smoking status: Never   Smokeless tobacco: Never  Vaping Use   Vaping Use: Never used  Substance and Sexual Activity   Alcohol use: No   Drug use: No   Sexual activity: Not on file  Other Topics Concern   Not on file  Social History Narrative   Not on file   Social Determinants of Health   Financial Resource Strain: Not on file  Food Insecurity: Not on file  Transportation Needs: Not on file  Physical Activity: Not on file  Stress: Not on file  Social Connections: Not on file     Family History: The patient's family history includes Asthma in his mother; CAD in his father; Heart attack in his brother; Heart failure  in his mother.  ROS:   Please see the history of present illness.     All other systems reviewed and are negative.  EKGs/Labs/Other Studies Reviewed:    The following studies were reviewed today:   EKG:  EKG is  ordered today.  The ekg ordered today demonstrates sinus bradycardia, otherwise normal ECG, heart rate 57.  Recent Labs: No results found for requested labs within last 8760 hours.  Recent Lipid Panel    Component Value Date/Time   CHOL 109 05/09/2019 0950   TRIG 94 05/09/2019 0950   HDL 34 (L) 05/09/2019 0950   CHOLHDL 3.2 05/09/2019 0950   CHOLHDL 8.0 10/27/2009 0012   VLDL 36 10/27/2009 0012   LDLCALC 57 05/09/2019 0950     Risk Assessment/Calculations:          Physical Exam:    VS:  BP 120/74 (BP Location: Left Arm, Patient Position: Sitting, Cuff Size: Normal)   Pulse (!) 57   Ht 5\' 10"  (1.778 m)   Wt 158 lb (71.7 kg)   SpO2 98%   BMI 22.67 kg/m     Wt Readings from Last 3 Encounters:  05/08/21 158 lb (71.7 kg)  10/31/20 162 lb 1.3 oz (73.5 kg)  05/15/20 155 lb (70.3 kg)     GEN:  Well nourished, well developed in no acute distress HEENT: Normal NECK: No JVD; No carotid bruits LYMPHATICS: No lymphadenopathy CARDIAC: RRR, no murmurs, rubs, gallops RESPIRATORY:  Clear to auscultation without rales, wheezing or rhonchi  ABDOMEN: Soft, non-tender, non-distended MUSCULOSKELETAL:  No edema; No deformity  SKIN: Warm and dry NEUROLOGIC:  Alert and oriented x 3 PSYCHIATRIC:  Normal affect   ASSESSMENT:    1. Coronary artery disease involving native coronary artery of native heart, unspecified whether angina present   2. Primary hypertension   3. Pure hypercholesterolemia    PLAN:    In order of problems listed above:  CAD/CABG x2 in 1996.  Has nonspecific chest pains.  Continue aspirin, Lipitor, Coreg, Imdur.  Obtain echocardiogram to evaluate EF.  Likely has stable angina which is well controlled on current doses of Coreg and  Imdur. Hypertension, BP controlled.  Continue Coreg, Imdur. Hyperlipidemia, cholesterol controlled.  Continue Lipitor 40 mg daily.   Follow-up after echocardiogram.     Medication Adjustments/Labs and Tests Ordered: Current medicines are reviewed at length with the patient today.  Concerns regarding medicines are outlined above.  Orders Placed This Encounter  Procedures   EKG 12-Lead   ECHOCARDIOGRAM COMPLETE    No orders of the defined types were placed in this encounter.   Patient Instructions  Medication Instructions:   Your physician recommends that  you continue on your current medications as directed. Please refer to the Current Medication list given to you today.  *If you need a refill on your cardiac medications before your next appointment, please call your pharmacy*   Lab Work: None ordered If you have labs (blood work) drawn today and your tests are completely normal, you will receive your results only by: MyChart Message (if you have MyChart) OR A paper copy in the mail If you have any lab test that is abnormal or we need to change your treatment, we will call you to review the results.   Testing/Procedures:  Your physician has requested that you have an echocardiogram. Echocardiography is a painless test that uses sound waves to create images of your heart. It provides your doctor with information about the size and shape of your heart and how well your heart's chambers and valves are working. This procedure takes approximately one hour. There are no restrictions for this procedure.    Follow-Up: At [facility], you and your health needs are our priority.  As part of our continuing mission to provide you with exceptional heart care, we have created designated Provider Care Teams.  These Care Teams include your primary Cardiologist (physician) and Advanced Practice Providers (APPs -  Physician Assistants and Nurse Practitioners) who all work together to  provide you with the care you need, when you need it.  We recommend signing up for the patient portal called "MyChart".  Sign up information is provided on this After Visit Summary.  MyChart is used to connect with patients for Virtual Visits (Telemedicine).  Patients are able to view lab/test results, encounter notes, upcoming appointments, etc.  Non-urgent messages can be sent to your provider as well.   To learn more about what you can do with MyChart, go to [email or web address].    Your next appointment:   Follow up 3 months   The format for your next appointment:   In Person  Provider:   Debbe Odea, MD   Other Instructions    Signed, Debbe Odea, MD  05/08/2021 1:10 PM    [facility]

## 2021-05-08 NOTE — Patient Instructions (Signed)
Medication Instructions:   Your physician recommends that you continue on your current medications as directed. Please refer to the Current Medication list given to you today.  *If you need a refill on your cardiac medications before your next appointment, please call your pharmacy*   Lab Work: None ordered If you have labs (blood work) drawn today and your tests are completely normal, you will receive your results only by: MyChart Message (if you have MyChart) OR A paper copy in the mail If you have any lab test that is abnormal or we need to change your treatment, we will call you to review the results.   Testing/Procedures:  Your physician has requested that you have an echocardiogram. Echocardiography is a painless test that uses sound waves to create images of your heart. It provides your doctor with information about the size and shape of your heart and how well your heart's chambers and valves are working. This procedure takes approximately one hour. There are no restrictions for this procedure.    Follow-Up: At [facility], you and your health needs are our priority.  As part of our continuing mission to provide you with exceptional heart care, we have created designated Provider Care Teams.  These Care Teams include your primary Cardiologist (physician) and Advanced Practice Providers (APPs -  Physician Assistants and Nurse Practitioners) who all work together to provide you with the care you need, when you need it.  We recommend signing up for the patient portal called "MyChart".  Sign up information is provided on this After Visit Summary.  MyChart is used to connect with patients for Virtual Visits (Telemedicine).  Patients are able to view lab/test results, encounter notes, upcoming appointments, etc.  Non-urgent messages can be sent to your provider as well.   To learn more about what you can do with MyChart, go to [email or web address].    Your next appointment:    Follow up 3 months   The format for your next appointment:   In Person  Provider:   Debbe Odea, MD   Other Instructions

## 2021-06-10 ENCOUNTER — Ambulatory Visit (INDEPENDENT_AMBULATORY_CARE_PROVIDER_SITE_OTHER): Payer: PPO

## 2021-06-10 ENCOUNTER — Other Ambulatory Visit: Payer: Self-pay

## 2021-06-10 DIAGNOSIS — I2510 Atherosclerotic heart disease of native coronary artery without angina pectoris: Secondary | ICD-10-CM

## 2021-06-10 MED ORDER — PERFLUTREN LIPID MICROSPHERE
1.0000 mL | INTRAVENOUS | Status: AC | PRN
  Administered 2021-06-10: 2 mL via INTRAVENOUS

## 2021-06-11 LAB — ECHOCARDIOGRAM COMPLETE
AR max vel: 2.6 cm2
AV Area VTI: 2.47 cm2
AV Area mean vel: 2.94 cm2
AV Mean grad: 3 mmHg
AV Peak grad: 4.9 mmHg
Ao pk vel: 1.11 m/s
Area-P 1/2: 3.53 cm2
Calc EF: 60.6 %
MV M vel: 5.34 m/s
MV Peak grad: 114.1 mmHg
S' Lateral: 3.6 cm
Single Plane A2C EF: 58.4 %
Single Plane A4C EF: 61.5 %

## 2021-08-06 DIAGNOSIS — J449 Chronic obstructive pulmonary disease, unspecified: Secondary | ICD-10-CM | POA: Diagnosis not present

## 2021-08-06 DIAGNOSIS — I25110 Atherosclerotic heart disease of native coronary artery with unstable angina pectoris: Secondary | ICD-10-CM | POA: Diagnosis not present

## 2021-08-06 DIAGNOSIS — I119 Hypertensive heart disease without heart failure: Secondary | ICD-10-CM | POA: Diagnosis not present

## 2021-08-06 DIAGNOSIS — M25551 Pain in right hip: Secondary | ICD-10-CM | POA: Diagnosis not present

## 2021-08-06 DIAGNOSIS — I209 Angina pectoris, unspecified: Secondary | ICD-10-CM | POA: Diagnosis not present

## 2021-08-06 DIAGNOSIS — Z23 Encounter for immunization: Secondary | ICD-10-CM | POA: Diagnosis not present

## 2021-08-06 DIAGNOSIS — E782 Mixed hyperlipidemia: Secondary | ICD-10-CM | POA: Diagnosis not present

## 2021-08-06 DIAGNOSIS — E1169 Type 2 diabetes mellitus with other specified complication: Secondary | ICD-10-CM | POA: Diagnosis not present

## 2021-08-07 ENCOUNTER — Ambulatory Visit: Payer: PPO | Admitting: Cardiology

## 2021-08-07 ENCOUNTER — Other Ambulatory Visit: Payer: Self-pay

## 2021-08-07 ENCOUNTER — Encounter: Payer: Self-pay | Admitting: Cardiology

## 2021-08-07 VITALS — BP 122/90 | HR 76 | Ht 70.0 in | Wt 167.0 lb

## 2021-08-07 DIAGNOSIS — M79605 Pain in left leg: Secondary | ICD-10-CM | POA: Diagnosis not present

## 2021-08-07 DIAGNOSIS — I2510 Atherosclerotic heart disease of native coronary artery without angina pectoris: Secondary | ICD-10-CM | POA: Diagnosis not present

## 2021-08-07 DIAGNOSIS — I10 Essential (primary) hypertension: Secondary | ICD-10-CM | POA: Diagnosis not present

## 2021-08-07 DIAGNOSIS — E7800 Pure hypercholesterolemia, unspecified: Secondary | ICD-10-CM | POA: Diagnosis not present

## 2021-08-07 DIAGNOSIS — M79604 Pain in right leg: Secondary | ICD-10-CM | POA: Diagnosis not present

## 2021-08-07 NOTE — Patient Instructions (Signed)
Medication Instructions:  - Your physician has recommended you make the following change in your medication:   1) STOP lipitor (atorvastatin)  *If you need a refill on your cardiac medications before your next appointment, please call your pharmacy*   Lab Work: - none ordered  If you have labs (blood work) drawn today and your tests are completely normal, you will receive your results only by: MyChart Message (if you have MyChart) OR A paper copy in the mail If you have any lab test that is abnormal or we need to change your treatment, we will call you to review the results.   Testing/Procedures: - none ordered   Follow-Up: At [facility], you and your health needs are our priority.  As part of our continuing mission to provide you with exceptional heart care, we have created designated Provider Care Teams.  These Care Teams include your primary Cardiologist (physician) and Advanced Practice Providers (APPs -  Physician Assistants and Nurse Practitioners) who all work together to provide you with the care you need, when you need it.  We recommend signing up for the patient portal called "MyChart".  Sign up information is provided on this After Visit Summary.  MyChart is used to connect with patients for Virtual Visits (Telemedicine).  Patients are able to view lab/test results, encounter notes, upcoming appointments, etc.  Non-urgent messages can be sent to your provider as well.   To learn more about what you can do with MyChart, go to [email or web address].    Your next appointment:   3 month(s)  The format for your next appointment:   In Person  Provider:   You may see Debbe Odea, MD or one of the following Advanced Practice Providers on your designated Care Team:   Nicolasa Ducking, NP Eula Listen, PA-C Cadence Fransico Michael, [place]    Other Instructions N/a

## 2021-08-07 NOTE — Progress Notes (Signed)
Cardiology Office Note:    Date:  08/07/2021   ID:  Edward [facility], DOB 1945-11-02, MRN [id number]  PCP:  Edward Raider, MD   [facility] Cardiologist:  Edward Herrlich, MD     Referring MD: Edward Raider, MD   Chief Complaint  Patient presents with   Follow-up    3 Months follow up and would like to discuss the Atorvastatin, patient has been having some muscle cramps. Medications verbally reviewed with patient.      History of Present Illness:    Edward Russo is a 75 y.o. male with a hx of CAD/CABG x 2 in 1996, hypertension, hyperlipidemia, COPD presenting for follow-up.  Last seen due to CAD and nonspecific chest pain occurring once.  Echo was ordered to evaluate any significant structural abnormalities.  Has had no further episodes.  Takes medications including Coreg, Imdur as prescribed.  No prior history of smoking.  Complains of lower extremity pain, feels like a cramp or bone pain around his calfs and pretibial area.  Prior notes Patient last seen at heart care [place] office, wish to switch to [facility] for easier commute.   He has a history of stable angina managed with beta-blocker and Imdur.   Has been told he has COPD, he denies any history of prior smoking.    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 SWALLOW WHOLE.   carvedilol (COREG) 3.125 MG tablet Take 1 tablet by mouth 2 (two) times daily.    isosorbide mononitrate (IMDUR) 30 MG 24 hr tablet Take 1 tablet (30 mg total) by mouth daily.   metFORMIN (GLUCOPHAGE) 850 MG tablet Take 1 tablet by mouth 2 (two) times daily with a meal.    nitroGLYCERIN (NITROSTAT) 0.4 MG SL tablet Place 1 tablet (0.4 mg total) under the tongue every 5 (five) minutes as needed for chest pain.   omeprazole (PRILOSEC) 20 MG capsule Take 20 mg by mouth daily.     Allergies:   Lisinopril   Social History   Socioeconomic History   Marital status: Married    Spouse name: Not on file   Number of children: Not on file   Years of education: Not on file   Highest education level: Not on file  Occupational History   Not on  file  Tobacco Use   Smoking status: Never   Smokeless tobacco: Never  Vaping Use   Vaping Use: Never used  Substance and Sexual Activity   Alcohol use: No   Drug use: No   Sexual activity: Not on file  Other Topics Concern   Not on file  Social History Narrative   Not on file   Social Determinants of [place]   Financial Resource Strain: Not on file  Food Insecurity: Not on file  Transportation Needs: Not on file  Physical Activity: Not on file  Stress: Not on file  Social Connections: Not on file     Family History: The patient's family history includes Asthma in his mother; CAD in his father; Heart attack in his brother; Heart failure  in his mother.  ROS:   Please see the history of present illness.     All other systems reviewed and are negative.  EKGs/Labs/Other Studies Reviewed:    The following studies were reviewed today:   EKG:  EKG is  ordered today.  The ekg ordered today demonstrates sinus bradycardia, otherwise normal ECG, heart rate 57.  Recent Labs: No results found for requested labs within last 8760 hours.  Recent Lipid Panel    Component Value Date/Time   CHOL 109 05/09/2019 0950   TRIG 94 05/09/2019 0950   HDL 34 (L) 05/09/2019 0950   CHOLHDL 3.2 05/09/2019 0950   CHOLHDL 8.0 10/27/2009 0012   VLDL 36 10/27/2009 0012   LDLCALC 57 05/09/2019 0950     Risk Assessment/Calculations:          Physical Exam:    VS:  BP 122/90 (BP Location: Left Arm, Patient Position: Sitting, Cuff Size: Normal)   Pulse 76   Ht 5\' 10"  (1.778 m)   Wt 167 lb (75.8 kg)   SpO2 97%   BMI 23.96 kg/m     Wt Readings from Last 3 Encounters:  08/07/21 167 lb (75.8 kg)  05/08/21 158 lb (71.7 kg)  10/31/20 162 lb 1.3 oz (73.5 kg)     GEN:  Well nourished, well developed in no acute distress HEENT: Normal NECK: No JVD; No carotid bruits LYMPHATICS: No lymphadenopathy CARDIAC: RRR, no murmurs, rubs, gallops RESPIRATORY:  Clear to auscultation without rales, wheezing or rhonchi  ABDOMEN: Soft, non-tender, non-distended MUSCULOSKELETAL:  No edema; No deformity  SKIN: Warm and dry NEUROLOGIC:  Alert and oriented x 3 PSYCHIATRIC:  Normal affect   ASSESSMENT:    1. Coronary artery disease involving native coronary artery of native heart, unspecified whether angina present   2. Primary hypertension   3. Pure hypercholesterolemia   4. Pain in both lower extremities     PLAN:    In order of problems listed above:  CAD/CABG x2 in 1996.  Echo EF 60 to 65%.  Denies chest pain.  Continue aspirin, Coreg, Imdur.   Likely has stable angina which is well controlled on current doses of Coreg and  Imdur. Hypertension, BP controlled.  Continue Coreg 3.125 mg twice daily, Imdur 30 mg daily. Hyperlipidemia, cholesterol controlled.  Low-cholesterol diet, leg pain, hold Lipitor. Leg pain,?  Statin intolerance.  Hold Lipitor x2 weeks at least to see if symptoms improve.  If symptoms improve, consider using lower dose of Pravachol.   Follow-up in 3 months    Medication Adjustments/Labs and Tests Ordered: Current medicines are reviewed at length with the patient today.  Concerns regarding medicines are outlined above.  No orders of the defined types were  placed in this encounter.   No orders of the defined types were placed in this encounter.   Patient Instructions  Medication Instructions:  - Your physician has recommended you make the following change in your medication:   1) STOP lipitor (atorvastatin)  *If you need a refill on your cardiac medications before your next appointment, please call your pharmacy*   Lab Work: - none ordered  If you have labs (blood work) drawn today and your tests are completely normal, you will receive your results only by: MyChart Message (if you have MyChart) OR A paper copy in the mail If you have any lab test that is abnormal or we need to change your treatment, we will call you to review the results.   Testing/Procedures: - none ordered   Follow-Up: At [facility], you and your [facility] needs are our priority.  As part of our continuing mission to provide you with exceptional heart care, we have created designated Provider Care Teams.  These Care Teams include your primary Cardiologist (physician) and Advanced Practice [facility] (APPs -  Physician Assistants and Nurse Practitioners) who all work together to provide you with the care you need, when you need it.  We recommend signing up for the patient portal called "MyChart".  Sign up information is provided on this After Visit Summary.  MyChart is used to connect with patients for Virtual  Visits (Telemedicine).  Patients are able to view lab/test results, encounter notes, upcoming appointments, etc.  Non-urgent messages can be sent to your provider as well.   To learn more about what you can do with MyChart, go to [email or web address].    Your next appointment:   3 month(s)  The format for your next appointment:   In Person  Provider:   You may see Debbe Odea, MD or one of the following Advanced Practice [facility] on your designated Care Team:   Nicolasa Ducking, NP Eula Listen, PA-C Cadence Fransico Michael, [place]    Other Instructions N/a   Signed, Debbe Odea, MD  08/07/2021 12:50 PM    [facility]

## 2021-08-16 ENCOUNTER — Other Ambulatory Visit: Payer: Self-pay | Admitting: Cardiology

## 2021-09-18 ENCOUNTER — Encounter: Payer: Self-pay | Admitting: Cardiology

## 2021-10-01 ENCOUNTER — Other Ambulatory Visit: Payer: Self-pay

## 2021-10-01 MED ORDER — PRAVASTATIN SODIUM 20 MG PO TABS: 20.0000 mg | ORAL_TABLET | Freq: Every evening | ORAL | 3 refills | Status: DC

## 2021-10-30 DIAGNOSIS — H2513 Age-related nuclear cataract, bilateral: Secondary | ICD-10-CM | POA: Diagnosis not present

## 2021-10-30 DIAGNOSIS — H52223 Regular astigmatism, bilateral: Secondary | ICD-10-CM | POA: Diagnosis not present

## 2021-10-30 DIAGNOSIS — H5203 Hypermetropia, bilateral: Secondary | ICD-10-CM | POA: Diagnosis not present

## 2021-10-30 DIAGNOSIS — Z7984 Long term (current) use of oral hypoglycemic drugs: Secondary | ICD-10-CM | POA: Diagnosis not present

## 2021-10-30 DIAGNOSIS — E119 Type 2 diabetes mellitus without complications: Secondary | ICD-10-CM | POA: Diagnosis not present

## 2021-10-30 DIAGNOSIS — H524 Presbyopia: Secondary | ICD-10-CM | POA: Diagnosis not present

## 2021-11-06 ENCOUNTER — Other Ambulatory Visit: Payer: Self-pay

## 2021-11-06 ENCOUNTER — Encounter: Payer: Self-pay | Admitting: Cardiology

## 2021-11-06 ENCOUNTER — Ambulatory Visit: Payer: PPO | Admitting: Cardiology

## 2021-11-06 VITALS — BP 130/82 | HR 55 | Ht 70.0 in | Wt 166.0 lb

## 2021-11-06 DIAGNOSIS — E7800 Pure hypercholesterolemia, unspecified: Secondary | ICD-10-CM

## 2021-11-06 DIAGNOSIS — I2510 Atherosclerotic heart disease of native coronary artery without angina pectoris: Secondary | ICD-10-CM | POA: Diagnosis not present

## 2021-11-06 DIAGNOSIS — I10 Essential (primary) hypertension: Secondary | ICD-10-CM | POA: Diagnosis not present

## 2021-11-06 NOTE — Patient Instructions (Signed)
Medication Instructions:  ? ?Your physician recommends that you continue on your current medications as directed. Please refer to the Current Medication list given to you today. ? ?*If you need a refill on your cardiac medications before your next appointment, please call your pharmacy* ? ? ?Lab Work: ? ?None Ordered ? ?If you have labs (blood work) drawn today and your tests are completely normal, you will receive your results only by: ?MyChart Message (if you have MyChart) OR ?A paper copy in the mail ?If you have any lab test that is abnormal or we need to change your treatment, we will call you to review the results. ? ? ?Testing/Procedures: ? ?None Ordered ? ? ?Follow-Up: ?At [facility], you and your health needs are our priority.  As part of our continuing mission to provide you with exceptional heart care, we have created designated Provider Care Teams.  These Care Teams include your primary Cardiologist (physician) and Advanced Practice Providers (APPs -  Physician Assistants and Nurse Practitioners) who all work together to provide you with the care you need, when you need it. ? ?We recommend signing up for the patient portal called "MyChart".  Sign up information is provided on this After Visit Summary.  MyChart is used to connect with patients for Virtual Visits (Telemedicine).  Patients are able to view lab/test results, encounter notes, upcoming appointments, etc.  Non-urgent messages can be sent to your provider as well.   ?To learn more about what you can do with MyChart, go to [email or web address].   ? ?Your next appointment:   ?12 month(s) ? ?The format for your next appointment:   ?In Person ? ?Provider:   ?Debbe Odea, MD ? ?

## 2021-11-06 NOTE — Progress Notes (Signed)
Cardiology Office Note:    Date:  11/06/2021   ID:  Edward [facility], DOB 11/01/1945, MRN [id number]  PCP:  Lupita RaiderShaw, Kimberlee, MD   [facility] Providers Cardiologist:  Norman HerrlichBrian Munley, MD     Referring MD: Lupita RaiderShaw, Kimberlee, MD   Chief Complaint  Patient presents with   Other    3 month follow up -- Patient c.o some SOB and chest pain. Meds reviewed verbally with patient.      History of Present Illness:    Edward [facility] is a 76 y.o. male with a hx of CAD/CABG x 2 in 1996, hypertension, hyperlipidemia, COPD presenting for follow-up.  Being seen for CAD and stable angina.  Angina is well controlled with Coreg and Imdur.  Previously had leg pain/cramping with taking Lipitor.  Lipitor was stopped, Pravachol 20 mg was started.  States having 1 episode of leg pain over a month ago, overall tolerating current dose of Pravachol.  No new concerns at this time.  Blood pressure is well controlled.   Prior notes Patient last seen at heart care [place] office, [facility].   He has a history of stable angina managed with beta-blocker and Imdur.   Has been told he has COPD, he denies any history of prior smoking.    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   Type 2 diabetes mellitus (HCC) 10/04/2017    Past Surgical History:  Procedure Laterality Date   BACK SURGERY     lower   CARDIAC CATHETERIZATION   2011   patent LTA and RTA at cath in 2011    CORONARY ARTERY BYPASS GRAFT  1996   TRANSURETHRAL RESECTION OF PROSTATE N/A 11/27/2019   Procedure: TRANSURETHRAL RESECTION OF THE PROSTATE (TURP);  Surgeon: Ihor Gullyttelin, Mark, MD;  [facility] [place];  Service: Urology;  Laterality: N/A;    Current Medications: Current Meds  Medication Sig   aspirin (ASPIRIN LOW DOSE) 81 MG EC tablet TAKE 1 TABLET (81 MG TOTAL) BY MOUTH DAILY. SWALLOW WHOLE.   carvedilol (COREG) 3.125 MG tablet Take 1 tablet by mouth 2 (two) times daily.    isosorbide mononitrate (IMDUR) 30 MG 24 hr tablet TAKE 1 TABLET BY MOUTH EVERY DAY   metFORMIN (GLUCOPHAGE) 850 MG tablet Take 1 tablet by mouth 2 (two) times daily with a meal.    nitroGLYCERIN (NITROSTAT) 0.4 MG SL tablet Place 1 tablet (0.4 mg total) under the tongue every 5 (five) minutes as needed for chest pain.   omeprazole (PRILOSEC) 20 MG capsule Take 20 mg by mouth daily.   pravastatin (PRAVACHOL) 20 MG tablet Take 1 tablet (20 mg total) by mouth every evening.     Allergies:   Lipitor [atorvastatin] and Lisinopril   Social History   Socioeconomic History   Marital status: Married    Spouse name: Not on file   Number of children: Not on file   Years of education: Not  on file   Highest education level: Not on file  Occupational History   Not on file  Tobacco Use   Smoking status: Never   Smokeless tobacco: Never  Vaping Use   Vaping Use: Never used  Substance and Sexual Activity   Alcohol use: No   Drug use: No   Sexual activity: Not on file  Other Topics Concern   Not on file  Social History Narrative   Not on file   Social Determinants of Health   Financial Resource Strain: Not on file  Food Insecurity: Not on file  Transportation Needs: Not on file  Physical Activity: Not on file  Stress: Not on file  Social Connections: Not on file     Family History: The patient's family history includes Asthma in his mother; CAD in  his father; Heart attack in his brother; Heart failure in his mother.  ROS:   Please see the history of present illness.     All other systems reviewed and are negative.  EKGs/Labs/Other Studies Reviewed:    The following studies were reviewed today:   EKG:  EKG is  ordered today.  The ekg ordered today demonstrates sinus bradycardia, otherwise normal ECG, heart rate 57.  Recent Labs: No results found for requested labs within last 8760 hours.  Recent Lipid Panel    Component Value Date/Time   CHOL 109 05/09/2019 0950   TRIG 94 05/09/2019 0950   HDL 34 (L) 05/09/2019 0950   CHOLHDL 3.2 05/09/2019 0950   CHOLHDL 8.0 10/27/2009 0012   VLDL 36 10/27/2009 0012   LDLCALC 57 05/09/2019 0950     Risk Assessment/Calculations:          Physical Exam:    VS:  BP 130/82 (BP Location: Left Arm, Patient Position: Sitting, Cuff Size: Normal)    Pulse (!) 55    Ht 5\' 10"  (1.778 m)    Wt 166 lb (75.3 kg)    SpO2 97%    BMI 23.82 kg/m     Wt Readings from Last 3 Encounters:  11/06/21 166 lb (75.3 kg)  08/07/21 167 lb (75.8 kg)  05/08/21 158 lb (71.7 kg)     GEN:  Well nourished, well developed in no acute distress HEENT: Normal NECK: No JVD; No carotid bruits LYMPHATICS: No lymphadenopathy CARDIAC: RRR, no murmurs, rubs, gallops RESPIRATORY:  Clear to auscultation without rales, wheezing or rhonchi  ABDOMEN: Soft, non-tender, non-distended MUSCULOSKELETAL:  No edema; No deformity  SKIN: Warm and dry NEUROLOGIC:  Alert and oriented x 3 PSYCHIATRIC:  Normal affect   ASSESSMENT:    1. Coronary artery disease involving native coronary artery of native heart, unspecified whether angina present   2. Primary hypertension   3. Pure hypercholesterolemia     PLAN:    In order of problems listed above:  CAD/CABG x2 in 1996.  Echo EF 60 to 65%.  Denies chest pain.  Continue aspirin, Coreg, Imdur.   Hypertension, BP controlled.  Continue Coreg 3.125 mg twice daily, Imdur 30 mg  daily. Hyperlipidemia, cholesterol controlled.  Low-cholesterol diet, leg pain improved with switching from Lipitor to Pravachol.  Continue Pravachol 20 mg daily.   Follow-up in 1 year or earlier if needed    Medication Adjustments/Labs and Tests Ordered: Current medicines are reviewed at length with the patient today.  Concerns regarding medicines are outlined above.  Orders Placed This Encounter  Procedures   EKG 12-Lead     No orders of the defined types were  placed in this encounter.    Patient Instructions  Medication Instructions:   Your physician recommends that you continue on your current medications as directed. Please refer to the Current Medication list given to you today.  *If you need a refill on your cardiac medications before your next appointment, please call your pharmacy*   Lab Work:  None Ordered  If you have labs (blood work) drawn today and your tests are completely normal, you will receive your results only by: MyChart Message (if you have MyChart) OR A paper copy in the mail If you have any lab test that is abnormal or we need to change your treatment, we will call you to review the results.   Testing/Procedures:  None Ordered   Follow-Up: At [facility], you and your health needs are our priority.  As part of our continuing mission to provide you with exceptional heart care, we have created designated Provider Care Teams.  These Care Teams include your primary Cardiologist (physician) and Advanced Practice Providers (APPs -  Physician Assistants and Nurse Practitioners) who all work together to provide you with the care you need, when you need it.  We recommend signing up for the patient portal called "MyChart".  Sign up information is provided on this After Visit Summary.  MyChart is used to connect with patients for Virtual Visits (Telemedicine).  Patients are able to view lab/test results, encounter notes, upcoming appointments, etc.   Non-urgent messages can be sent to your provider as well.   To learn more about what you can do with MyChart, go to [email or web address].    Your next appointment:   12 month(s)  The format for your next appointment:   In Person  Provider:   Debbe Odea, MD    Signed, Debbe Odea, MD  11/06/2021 12:13 PM    [facility]

## 2022-01-01 ENCOUNTER — Other Ambulatory Visit: Payer: Self-pay

## 2022-01-01 MED ORDER — PRAVASTATIN SODIUM 20 MG PO TABS: 20.0000 mg | ORAL_TABLET | Freq: Every evening | ORAL | 0 refills | Status: DC

## 2022-01-26 ENCOUNTER — Other Ambulatory Visit: Payer: Self-pay | Admitting: Cardiology

## 2022-02-24 DIAGNOSIS — E782 Mixed hyperlipidemia: Secondary | ICD-10-CM | POA: Diagnosis not present

## 2022-02-24 DIAGNOSIS — J449 Chronic obstructive pulmonary disease, unspecified: Secondary | ICD-10-CM | POA: Diagnosis not present

## 2022-02-24 DIAGNOSIS — K219 Gastro-esophageal reflux disease without esophagitis: Secondary | ICD-10-CM | POA: Diagnosis not present

## 2022-02-24 DIAGNOSIS — I119 Hypertensive heart disease without heart failure: Secondary | ICD-10-CM | POA: Diagnosis not present

## 2022-02-24 DIAGNOSIS — Z23 Encounter for immunization: Secondary | ICD-10-CM | POA: Diagnosis not present

## 2022-02-24 DIAGNOSIS — G14 Postpolio syndrome: Secondary | ICD-10-CM | POA: Diagnosis not present

## 2022-02-24 DIAGNOSIS — I209 Angina pectoris, unspecified: Secondary | ICD-10-CM | POA: Diagnosis not present

## 2022-02-24 DIAGNOSIS — Z0000 Encounter for general adult medical examination without abnormal findings: Secondary | ICD-10-CM | POA: Diagnosis not present

## 2022-02-24 DIAGNOSIS — E1169 Type 2 diabetes mellitus with other specified complication: Secondary | ICD-10-CM | POA: Diagnosis not present

## 2022-02-24 DIAGNOSIS — I25110 Atherosclerotic heart disease of native coronary artery with unstable angina pectoris: Secondary | ICD-10-CM | POA: Diagnosis not present

## 2022-02-24 DIAGNOSIS — N400 Enlarged prostate without lower urinary tract symptoms: Secondary | ICD-10-CM | POA: Diagnosis not present

## 2022-02-24 DIAGNOSIS — M549 Dorsalgia, unspecified: Secondary | ICD-10-CM | POA: Diagnosis not present

## 2022-04-29 DIAGNOSIS — H5203 Hypermetropia, bilateral: Secondary | ICD-10-CM | POA: Diagnosis not present

## 2022-04-29 DIAGNOSIS — H52223 Regular astigmatism, bilateral: Secondary | ICD-10-CM | POA: Diagnosis not present

## 2022-04-29 DIAGNOSIS — H524 Presbyopia: Secondary | ICD-10-CM | POA: Diagnosis not present

## 2022-04-29 DIAGNOSIS — E119 Type 2 diabetes mellitus without complications: Secondary | ICD-10-CM | POA: Diagnosis not present

## 2022-04-29 DIAGNOSIS — Z7984 Long term (current) use of oral hypoglycemic drugs: Secondary | ICD-10-CM | POA: Diagnosis not present

## 2022-04-29 DIAGNOSIS — H2513 Age-related nuclear cataract, bilateral: Secondary | ICD-10-CM | POA: Diagnosis not present

## 2022-05-07 ENCOUNTER — Other Ambulatory Visit: Payer: Self-pay

## 2022-05-07 MED ORDER — PRAVASTATIN SODIUM 20 MG PO TABS: 20.0000 mg | ORAL_TABLET | Freq: Every evening | ORAL | 1 refills | Status: DC

## 2022-08-03 ENCOUNTER — Other Ambulatory Visit: Payer: Self-pay

## 2022-08-03 MED ORDER — PRAVASTATIN SODIUM 20 MG PO TABS: 20.0000 mg | ORAL_TABLET | Freq: Every evening | ORAL | 0 refills | Status: DC

## 2022-09-03 ENCOUNTER — Other Ambulatory Visit: Payer: Self-pay

## 2022-09-03 MED ORDER — ISOSORBIDE MONONITRATE ER 30 MG PO TB24: 30.0000 mg | ORAL_TABLET | Freq: Every day | ORAL | 1 refills | Status: DC

## 2022-11-04 DIAGNOSIS — H524 Presbyopia: Secondary | ICD-10-CM | POA: Diagnosis not present

## 2022-11-04 DIAGNOSIS — H5203 Hypermetropia, bilateral: Secondary | ICD-10-CM | POA: Diagnosis not present

## 2022-11-04 DIAGNOSIS — E119 Type 2 diabetes mellitus without complications: Secondary | ICD-10-CM | POA: Diagnosis not present

## 2022-11-04 DIAGNOSIS — H52223 Regular astigmatism, bilateral: Secondary | ICD-10-CM | POA: Diagnosis not present

## 2022-11-04 DIAGNOSIS — H2513 Age-related nuclear cataract, bilateral: Secondary | ICD-10-CM | POA: Diagnosis not present

## 2022-11-04 DIAGNOSIS — Z7984 Long term (current) use of oral hypoglycemic drugs: Secondary | ICD-10-CM | POA: Diagnosis not present

## 2022-12-31 ENCOUNTER — Encounter: Payer: Self-pay | Admitting: Cardiology

## 2022-12-31 ENCOUNTER — Ambulatory Visit: Payer: PPO | Attending: Cardiology | Admitting: Cardiology

## 2022-12-31 VITALS — BP 102/64 | HR 61 | Ht 70.0 in | Wt 158.4 lb

## 2022-12-31 DIAGNOSIS — I2510 Atherosclerotic heart disease of native coronary artery without angina pectoris: Secondary | ICD-10-CM

## 2022-12-31 DIAGNOSIS — E7800 Pure hypercholesterolemia, unspecified: Secondary | ICD-10-CM

## 2022-12-31 DIAGNOSIS — I10 Essential (primary) hypertension: Secondary | ICD-10-CM

## 2022-12-31 NOTE — Progress Notes (Signed)
Cardiology Office Note:    Date:  12/31/2022   ID:  Edward Russo, DOB 1946/07/02, MRN [id number]  PCP:  [name], MD   [facility] Providers Cardiologist:  Debbe Odea, MD     Referring MD: [name], MD   No chief complaint on file.    History of Present Illness:    Edward Russo is a 77 y.o. male with a hx of CAD/CABG x 2 in 1996, hypertension, hyperlipidemia, COPD, hard of hearing, presenting for follow-up.  He feels well, denies any chest pain or shortness of breath, tolerating medications as prescribed.  Denies any muscle aches with Pravachol.  Previously did not tolerate Lipitor.  BP is well-controlled.  Prior notes Patient last seen at heart care [place] office, wish to switch to [facility] for easier commute.   He has a history of stable angina managed with beta-blocker and Imdur.   Has been told he has COPD, he denies any history of prior smoking.    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 SWALLOW WHOLE.   carvedilol (COREG) 3.125 MG tablet Take 1 tablet by mouth 2 (two) times daily.    isosorbide mononitrate (IMDUR) 30 MG 24 hr tablet Take 1 tablet (30 mg total) by mouth daily.   metFORMIN (GLUCOPHAGE) 850 MG tablet Take 1 tablet by mouth 2 (two) times daily with a meal.    nitroGLYCERIN (NITROSTAT) 0.4 MG SL tablet Place 1 tablet (0.4 mg total) under the tongue every 5 (five) minutes as needed for chest pain.   omeprazole (PRILOSEC) 20 MG capsule Take 20 mg by mouth daily.   pravastatin (PRAVACHOL) 20 MG tablet Take 1 tablet (20 mg total) by mouth every evening.     Allergies:   Lipitor [atorvastatin] and Lisinopril   Social History   Socioeconomic History   Marital status: Married    Spouse name: Not on file   Number of children: Not on file   Years of education: Not on file   Highest education level: Not on file  Occupational History   Not on file  Tobacco Use   Smoking status: Never   Smokeless tobacco: Never  Vaping Use   Vaping Use: Never used  Substance and Sexual Activity   Alcohol use: No   Drug use:  No   Sexual activity: Not on file  Other Topics Concern   Not on file  Social History Narrative   Not on file   Social Determinants of Health   Financial Resource Strain: Not on file  Food Insecurity: Not on file  Transportation Needs: Not on file  Physical Activity: Not on file  Stress: Not on file  Social Connections: Not on file     Family History: The patient's family history includes Asthma in his mother; CAD in his father; Heart attack in his brother; Heart failure in his mother.  ROS:   Please see the history of present illness.     All other systems reviewed and are negative.  EKGs/Labs/Other Studies Reviewed:    The following studies were  reviewed today:   EKG:  EKG is  ordered today.  The ekg ordered today demonstrates sinus rhythm, occasional PVC  Recent Labs: No results found for requested labs within last 365 days.  Recent Lipid Panel    Component Value Date/Time   CHOL 109 05/09/2019 0950   TRIG 94 05/09/2019 0950   HDL 34 (L) 05/09/2019 0950   CHOLHDL 3.2 05/09/2019 0950   CHOLHDL 8.0 10/27/2009 0012   VLDL 36 10/27/2009 0012   LDLCALC 57 05/09/2019 0950     Risk Assessment/Calculations:          Physical Exam:    VS:  BP 102/64 (BP Location: Left Arm, Patient Position: Sitting, Cuff Size: Normal)   Pulse 61   Ht 5\' 10"  (1.778 m)   Wt 158 lb 6.4 oz (71.8 kg)   SpO2 96%   BMI 22.73 kg/m     Wt Readings from Last 3 Encounters:  12/31/22 158 lb 6.4 oz (71.8 kg)  11/06/21 166 lb (75.3 kg)  08/07/21 167 lb (75.8 kg)     GEN:  Well nourished, well developed in no acute distress HEENT: Hard of hearing, NECK: No JVD; No carotid bruits CARDIAC: RRR, no murmurs, rubs, gallops RESPIRATORY:  Clear to auscultation without rales, wheezing or rhonchi  ABDOMEN: Soft, non-tender, non-distended MUSCULOSKELETAL:  No edema; No deformity  SKIN: Warm and dry NEUROLOGIC:  Alert and oriented x 3 PSYCHIATRIC:  Normal affect   ASSESSMENT:    1. Coronary artery disease involving native coronary artery of native heart, unspecified whether angina present   2. Primary hypertension   3. Pure hypercholesterolemia    PLAN:    In order of problems listed above:  CAD/CABG x2 in 1996.  Echo EF 60 to 65%.  Denies chest pain.  Continue aspirin, Coreg, Imdur.    Hypertension, BP controlled.  Continue Coreg 3.125 mg twice daily, Imdur 30 mg daily. Hyperlipidemia, cholesterol controlled. Continue Pravachol 20 mg daily.  Patient did not tolerate Lipitor in the past.  Follow-up in 1 year     Medication Adjustments/Labs and Tests Ordered: Current medicines are reviewed at length with the patient today.  Concerns  regarding medicines are outlined above.  No orders of the defined types were placed in this encounter.    No orders of the defined types were placed in this encounter.    Patient Instructions  Medication Instructions:   Your physician recommends that you continue on your current medications as directed. Please refer to the Current Medication list given to you today.  *If you need a refill on your cardiac medications before your next appointment, please call your pharmacy*   Lab Work:  None Ordered  If you have labs (blood work)  drawn today and your tests are completely normal, you will receive your results only by: MyChart Message (if you have MyChart) OR A paper copy in the mail If you have any lab test that is abnormal or we need to change your treatment, we will call you to review the results.   Testing/Procedures:  None Ordered   Follow-Up: At [facility], you and your health needs are our priority.  As part of our continuing mission to provide you with exceptional heart care, we have created designated Provider Care Teams.  These Care Teams include your primary Cardiologist (physician) and Advanced Practice Providers (APPs -  Physician Assistants and Nurse Practitioners) who all work together to provide you with the care you need, when you need it.  We recommend signing up for the patient portal called "MyChart".  Sign up information is provided on this After Visit Summary.  MyChart is used to connect with patients for Virtual Visits (Telemedicine).  Patients are able to view lab/test results, encounter notes, upcoming appointments, etc.  Non-urgent messages can be sent to your provider as well.   To learn more about what you can do with MyChart, go to [email or web address].    Your next appointment:   12 month(s)  Provider:   You may see Debbe Odea, MD or one of the following Advanced Practice Providers on your designated Care Team:   Nicolasa Ducking, NP Eula Listen, PA-C Cadence Fransico Michael, PA-C Charlsie Quest, NP   Signed, Debbe Odea, MD  12/31/2022 9:32 AM    [facility]

## 2022-12-31 NOTE — Patient Instructions (Signed)
Medication Instructions:   Your physician recommends that you continue on your current medications as directed. Please refer to the Current Medication list given to you today.  *If you need a refill on your cardiac medications before your next appointment, please call your pharmacy*   Lab Work:  None Ordered  If you have labs (blood work) drawn today and your tests are completely normal, you will receive your results only by: MyChart Message (if you have MyChart) OR A paper copy in the mail If you have any lab test that is abnormal or we need to change your treatment, we will call you to review the results.   Testing/Procedures:  None Ordered    Follow-Up: At [facility], you and your health needs are our priority.  As part of our continuing mission to provide you with exceptional heart care, we have created designated Provider Care Teams.  These Care Teams include your primary Cardiologist (physician) and Advanced Practice Providers (APPs -  Physician Assistants and Nurse Practitioners) who all work together to provide you with the care you need, when you need it.  We recommend signing up for the patient portal called "MyChart".  Sign up information is provided on this After Visit Summary.  MyChart is used to connect with patients for Virtual Visits (Telemedicine).  Patients are able to view lab/test results, encounter notes, upcoming appointments, etc.  Non-urgent messages can be sent to your provider as well.   To learn more about what you can do with MyChart, go to [email or web address].    Your next appointment:   12 month(s)  Provider:   You may see Brian Agbor-Etang, MD or one of the following Advanced Practice Providers on your designated Care Team:   Christopher Berge, NP Ryan Dunn, PA-C Cadence Furth, PA-C Sheri Hammock, NP  

## 2023-01-05 DIAGNOSIS — H903 Sensorineural hearing loss, bilateral: Secondary | ICD-10-CM | POA: Diagnosis not present

## 2023-01-05 NOTE — Addendum Note (Signed)
Addended by: Auburn Bilberry D on: 01/05/2023 09:38 AM   Modules accepted: Orders

## 2023-02-10 ENCOUNTER — Other Ambulatory Visit: Payer: Self-pay

## 2023-02-10 MED ORDER — PRAVASTATIN SODIUM 20 MG PO TABS: 20.0000 mg | ORAL_TABLET | Freq: Every evening | ORAL | 1 refills | Status: DC

## 2023-03-02 ENCOUNTER — Other Ambulatory Visit: Payer: Self-pay

## 2023-03-02 MED ORDER — ISOSORBIDE MONONITRATE ER 30 MG PO TB24: 30.0000 mg | ORAL_TABLET | Freq: Every day | ORAL | 2 refills | Status: DC

## 2023-03-02 NOTE — Telephone Encounter (Signed)
Requested Prescriptions   Signed Prescriptions Disp Refills   isosorbide mononitrate (IMDUR) 30 MG 24 hr tablet 90 tablet 2    Sig: Take 1 tablet (30 mg total) by mouth daily.    Authorizing Provider: Debbe Odea    Ordering User: Guerry Minors

## 2023-03-09 DIAGNOSIS — I25118 Atherosclerotic heart disease of native coronary artery with other forms of angina pectoris: Secondary | ICD-10-CM | POA: Diagnosis not present

## 2023-03-09 DIAGNOSIS — I119 Hypertensive heart disease without heart failure: Secondary | ICD-10-CM | POA: Diagnosis not present

## 2023-03-09 DIAGNOSIS — E1169 Type 2 diabetes mellitus with other specified complication: Secondary | ICD-10-CM | POA: Diagnosis not present

## 2023-03-09 DIAGNOSIS — E119 Type 2 diabetes mellitus without complications: Secondary | ICD-10-CM | POA: Diagnosis not present

## 2023-03-09 DIAGNOSIS — G14 Postpolio syndrome: Secondary | ICD-10-CM | POA: Diagnosis not present

## 2023-03-09 DIAGNOSIS — J449 Chronic obstructive pulmonary disease, unspecified: Secondary | ICD-10-CM | POA: Diagnosis not present

## 2023-03-09 DIAGNOSIS — K219 Gastro-esophageal reflux disease without esophagitis: Secondary | ICD-10-CM | POA: Diagnosis not present

## 2023-03-09 DIAGNOSIS — Z0000 Encounter for general adult medical examination without abnormal findings: Secondary | ICD-10-CM | POA: Diagnosis not present

## 2023-03-09 DIAGNOSIS — E782 Mixed hyperlipidemia: Secondary | ICD-10-CM | POA: Diagnosis not present

## 2023-03-09 DIAGNOSIS — N400 Enlarged prostate without lower urinary tract symptoms: Secondary | ICD-10-CM | POA: Diagnosis not present

## 2023-03-10 ENCOUNTER — Encounter: Payer: Self-pay | Admitting: Cardiology

## 2023-05-14 ENCOUNTER — Other Ambulatory Visit: Payer: Self-pay

## 2023-05-14 MED ORDER — PRAVASTATIN SODIUM 20 MG PO TABS: 20.0000 mg | ORAL_TABLET | Freq: Every evening | ORAL | 3 refills | Status: DC

## 2023-06-14 DIAGNOSIS — H2513 Age-related nuclear cataract, bilateral: Secondary | ICD-10-CM | POA: Diagnosis not present

## 2023-06-14 DIAGNOSIS — H5203 Hypermetropia, bilateral: Secondary | ICD-10-CM | POA: Diagnosis not present

## 2023-06-14 DIAGNOSIS — H52223 Regular astigmatism, bilateral: Secondary | ICD-10-CM | POA: Diagnosis not present

## 2023-06-14 DIAGNOSIS — H40013 Open angle with borderline findings, low risk, bilateral: Secondary | ICD-10-CM | POA: Diagnosis not present

## 2023-06-14 DIAGNOSIS — E119 Type 2 diabetes mellitus without complications: Secondary | ICD-10-CM | POA: Diagnosis not present

## 2023-06-14 DIAGNOSIS — Z7984 Long term (current) use of oral hypoglycemic drugs: Secondary | ICD-10-CM | POA: Diagnosis not present

## 2023-06-14 DIAGNOSIS — H524 Presbyopia: Secondary | ICD-10-CM | POA: Diagnosis not present

## 2023-08-19 ENCOUNTER — Other Ambulatory Visit: Payer: Self-pay

## 2023-08-19 MED ORDER — PRAVASTATIN SODIUM 20 MG PO TABS: 20.0000 mg | ORAL_TABLET | Freq: Every evening | ORAL | 2 refills | Status: DC

## 2023-09-03 ENCOUNTER — Other Ambulatory Visit: Payer: Self-pay

## 2023-09-03 MED ORDER — ISOSORBIDE MONONITRATE ER 30 MG PO TB24: 30.0000 mg | ORAL_TABLET | Freq: Every day | ORAL | 2 refills | Status: DC

## 2023-09-15 DIAGNOSIS — E119 Type 2 diabetes mellitus without complications: Secondary | ICD-10-CM | POA: Diagnosis not present

## 2023-09-15 DIAGNOSIS — H02831 Dermatochalasis of right upper eyelid: Secondary | ICD-10-CM | POA: Diagnosis not present

## 2023-09-15 DIAGNOSIS — H04123 Dry eye syndrome of bilateral lacrimal glands: Secondary | ICD-10-CM | POA: Diagnosis not present

## 2023-09-15 DIAGNOSIS — H2513 Age-related nuclear cataract, bilateral: Secondary | ICD-10-CM | POA: Diagnosis not present

## 2023-11-29 ENCOUNTER — Other Ambulatory Visit: Payer: Self-pay

## 2023-11-29 MED ORDER — ISOSORBIDE MONONITRATE ER 30 MG PO TB24
30.0000 mg | ORAL_TABLET | Freq: Every day | ORAL | 0 refills | Status: DC
Start: 2023-11-29 — End: 2024-03-07

## 2024-01-03 ENCOUNTER — Ambulatory Visit: Attending: Cardiology | Admitting: Cardiology

## 2024-01-03 ENCOUNTER — Encounter: Payer: Self-pay | Admitting: Cardiology

## 2024-01-03 VITALS — BP 114/74 | HR 78 | Ht 70.0 in | Wt 151.8 lb

## 2024-01-03 DIAGNOSIS — E7800 Pure hypercholesterolemia, unspecified: Secondary | ICD-10-CM | POA: Diagnosis not present

## 2024-01-03 DIAGNOSIS — I2510 Atherosclerotic heart disease of native coronary artery without angina pectoris: Secondary | ICD-10-CM | POA: Diagnosis not present

## 2024-01-03 DIAGNOSIS — I10 Essential (primary) hypertension: Secondary | ICD-10-CM

## 2024-01-03 DIAGNOSIS — I4891 Unspecified atrial fibrillation: Secondary | ICD-10-CM | POA: Diagnosis not present

## 2024-01-03 MED ORDER — RIVAROXABAN 20 MG PO TABS: 20.0000 mg | ORAL_TABLET | Freq: Every day | ORAL | 3 refills | Status: DC

## 2024-01-03 NOTE — Patient Instructions (Addendum)
 Medication Instructions:  Your physician recommends the following medication changes.  START TAKING: Xarelto 20 mg daily  *If you need a refill on your cardiac medications before your next appointment, please call your pharmacy*  Lab Work: No labs ordered today.   Testing/Procedures: No test ordered today.   Follow-Up: At [facility], you and your health needs are our priority.  As part of our continuing mission to provide you with exceptional heart care, our providers are all part of one team.  This team includes your primary Cardiologist (physician) and Advanced Practice Providers or APPs (Physician Assistants and Nurse Practitioners) who all work together to provide you with the care you need, when you need it.  Your next appointment:   6 weeks  Provider:   You may see Constancia Delton, MD or one of the following Advanced Practice Providers on your designated Care Team:   Laneta Pintos, NP Gildardo Labrador, PA-C Varney Gentleman, PA-C Cadence [place], PA-C Ronald Cockayne, NP Morey Ar, NP    We recommend signing up for the patient portal called "MyChart".  Sign up information is provided on this After Visit Summary.  MyChart is used to connect with patients for Virtual Visits (Telemedicine).  Patients are able to view lab/test results, encounter notes, upcoming appointments, etc.  Non-urgent messages can be sent to your provider as well.   To learn more about what you can do with MyChart, go to [email or web address].   Other Instructions Primary care recommendations per Dr. Junnie Olives  -Dellar Fenton, MD  =Teresa Madelon Scheuermann, MD

## 2024-01-03 NOTE — Progress Notes (Signed)
 Cardiology Office Note:    Date:  01/03/2024   ID:  Edward Russo, DOB 1945/12/15, MRN [id number]  PCP:  Edward Landau, MD   [facility] Providers Cardiologist:  Edward Delton, MD     Referring MD: Edward Landau, MD   Chief Complaint  Patient presents with   Follow-up    12 month follow up visit. Patient states that he has been tired and has been feeling tired for a couple of weeks. Patient states that he felt some chest discomfort yesterday. Two months ago, Patient states that he was vacuuming, he states that every time he pushed the vacuum he felt something on the right side of his chest. Meds reviewed.      History of Present Illness:    Edward Russo is a 78 y.o. male with a hx of CAD/CABG x 2 in 1996, hypertension, hyperlipidemia, COPD, hard of hearing, presenting for follow-up.  Doing okay, endorses occasional fatigue with exertion.  Rare chest pains which is chronic.  Denies palpitations, dizziness or syncope.  BP adequately controlled on current medicines.  Prior notes Patient last seen at heart care [place] office, wish to switch to [facility] for easier commute.   He has a history of stable angina managed with beta-blocker and Imdur .   Has been told he has COPD, he denies any history of prior smoking.    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   Type 2 diabetes mellitus (HCC) 10/04/2017    Past Surgical  History:  Procedure Laterality Date   BACK SURGERY     lower   CARDIAC CATHETERIZATION  2011   patent LTA and RTA at cath in 2011    CORONARY ARTERY BYPASS GRAFT  1996   TRANSURETHRAL RESECTION OF PROSTATE N/A 11/27/2019   Procedure: TRANSURETHRAL RESECTION OF THE PROSTATE (TURP);  Surgeon: Ottelin, Mark, MD;  Location: [facility];  Service: Urology;  Laterality: N/A;    Current Medications: Current Meds  Medication Sig   aspirin  EC (ASPIRIN  LOW DOSE) 81 MG tablet TAKE 1 TABLET (81 MG TOTAL) BY MOUTH DAILY. SWALLOW WHOLE.   carvedilol  (COREG ) 3.125 MG tablet Take 1 tablet by mouth 2 (two) times daily.    isosorbide  mononitrate (IMDUR ) 30 MG 24 hr tablet Take 1 tablet (30 mg total) by mouth daily.   metFORMIN  (GLUCOPHAGE ) 850 MG tablet Take 1 tablet by mouth 2 (two) times daily with a meal.    nitroGLYCERIN  (NITROSTAT ) 0.4 MG SL tablet Place 1 tablet (0.4 mg total) under the tongue every 5 (five) minutes as needed for chest pain.   omeprazole (PRILOSEC) 20 MG capsule Take 20 mg by mouth daily.   pravastatin  (PRAVACHOL ) 20 MG tablet Take 1 tablet (20 mg total) by mouth every evening.   rivaroxaban (XARELTO) 20 MG TABS tablet Take 1 tablet (20 mg total) by mouth daily with supper.     Allergies:   Lipitor [atorvastatin] and Lisinopril   Social History  Socioeconomic History   Marital status: Married    Spouse name: Not on file   Number of children: Not on file   Years of education: Not on file   Highest education level: Not on file  Occupational History   Not on file  Tobacco Use   Smoking status: Never   Smokeless tobacco: Never  Vaping Use   Vaping status: Never Used  Substance and Sexual Activity   Alcohol use: No   Drug use: No   Sexual activity: Not on file  Other Topics Concern   Not on file  Social History Narrative   Not on file   Social Drivers of Health   Financial Resource Strain: Not on file  Food Insecurity: Not on file  Transportation  Needs: Not on file  Physical Activity: Not on file  Stress: Not on file  Social Connections: Not on file     Family History: The patient's family history includes Asthma in his mother; CAD in his father; Heart attack in his brother; Heart failure in his mother.  ROS:   Please see the history of present illness.     All other systems reviewed and are negative.  EKGs/Labs/Other Studies Reviewed:    The following studies were reviewed today:  EKG Interpretation Date/Time:  Monday Jan 03 2024 11:11:13 EDT Ventricular Rate:  78 PR Interval:    QRS Duration:  88 QT Interval:  362 QTC Calculation: 412 R Axis:   23  Text Interpretation: Atrial fibrillation with premature ventricular or aberrantly conducted complexes Confirmed by Edward Russo ([id number]) on 01/03/2024 11:23:21 AM    Recent Labs: No results found for requested labs within last 365 days.  Recent Lipid Panel    Component Value Date/Time   CHOL 109 05/09/2019 0950   TRIG 94 05/09/2019 0950   HDL 34 (L) 05/09/2019 0950   CHOLHDL 3.2 05/09/2019 0950   CHOLHDL 8.0 10/27/2009 0012   VLDL 36 10/27/2009 0012   LDLCALC 57 05/09/2019 0950     Risk Assessment/Calculations:          Physical Exam:    VS:  BP 114/74   Pulse 78   Ht 5\' 10"  (1.778 m)   Wt 151 lb 12.8 oz (68.9 kg)   SpO2 96%   BMI 21.78 kg/m     Wt Readings from Last 3 Encounters:  01/03/24 151 lb 12.8 oz (68.9 kg)  12/31/22 158 lb 6.4 oz (71.8 kg)  11/06/21 166 lb (75.3 kg)     GEN:  Well nourished, well developed in no acute distress HEENT: Hard of hearing, NECK: No JVD; No carotid bruits CARDIAC: Irregular irregular RESPIRATORY:  Clear to auscultation without rales, wheezing or rhonchi  ABDOMEN: Soft, non-tender, non-distended MUSCULOSKELETAL:  No edema; No deformity  SKIN: Warm and dry NEUROLOGIC:  Alert and oriented x 3 PSYCHIATRIC:  Normal affect   ASSESSMENT:    1. Atrial fibrillation, unspecified type (HCC)   2. Coronary  artery disease involving native coronary artery of native heart, unspecified whether angina present   3. Primary hypertension   4. Pure hypercholesterolemia    PLAN:    In order of problems listed above:  Atrial fibrillation, new onset.  Heart rate controlled.  Continue Coreg  3.125 mg twice daily, start Xarelto.  Referred to A-fib/EP clinic.  Plan DC cardioversion at follow-up visit if patient still in A-fib. CAD/CABG x2 in 1996.  Echo 10/22 EF 60 to 65%.  Stop aspirin , start Xarelto 20 mg daily, Pravachol  20  mg daily Coreg  3.125 mg twice daily, Imdur  30 mg daily.    Hypertension, BP controlled.  Continue Coreg  3.125 mg twice daily, Imdur  30 mg daily. Hyperlipidemia, cholesterol controlled. Continue Pravachol  20 mg daily.    Follow-up in 6 weeks.    Medication Adjustments/Labs and Tests Ordered: Current medicines are reviewed at length with the patient today.  Concerns regarding medicines are outlined above.  Orders Placed This Encounter  Procedures   Ambulatory referral to Cardiac Electrophysiology   EKG 12-Lead     Meds ordered this encounter  Medications   rivaroxaban (XARELTO) 20 MG TABS tablet    Sig: Take 1 tablet (20 mg total) by mouth daily with supper.    Dispense:  30 tablet    Refill:  3     Patient Instructions  Medication Instructions:  Your physician recommends the following medication changes.  START TAKING: Xarelto 20 mg daily  *If you need a refill on your cardiac medications before your next appointment, please call your pharmacy*  Lab Work: No labs ordered today.   Testing/Procedures: No test ordered today.   Follow-Up: At [facility], you and your health needs are our priority.  As part of our continuing mission to provide you with exceptional heart care, our providers are all part of one team.  This team includes your primary Cardiologist (physician) and Advanced Practice Providers or APPs (Physician Assistants and Nurse Practitioners)  who all work together to provide you with the care you need, when you need it.  Your next appointment:   6 weeks  Provider:   You may see Edward Delton, MD or one of the following Advanced Practice Providers on your designated Care Team:   Laneta Pintos, NP Gildardo Labrador, PA-C Varney Gentleman, PA-C Cadence [place], PA-C Ronald Cockayne, NP Morey Ar, NP    We recommend signing up for the patient portal called "MyChart".  Sign up information is provided on this After Visit Summary.  MyChart is used to connect with patients for Virtual Visits (Telemedicine).  Patients are able to view lab/test results, encounter notes, upcoming appointments, etc.  Non-urgent messages can be sent to your provider as well.   To learn more about what you can do with MyChart, go to [email or web address].   Other Instructions Primary care recommendations per Dr. Junnie Olives  -Dellar Fenton, MD  =Teresa Madelon Scheuermann, MD        Signed, Edward Delton, MD  01/03/2024 12:14 PM    [facility]

## 2024-02-14 ENCOUNTER — Ambulatory Visit: Attending: Cardiology | Admitting: Cardiology

## 2024-02-14 ENCOUNTER — Encounter: Payer: Self-pay | Admitting: Cardiology

## 2024-02-14 VITALS — BP 124/70 | HR 72 | Ht 70.0 in | Wt 152.2 lb

## 2024-02-14 DIAGNOSIS — I10 Essential (primary) hypertension: Secondary | ICD-10-CM

## 2024-02-14 DIAGNOSIS — I4819 Other persistent atrial fibrillation: Secondary | ICD-10-CM

## 2024-02-14 DIAGNOSIS — Z951 Presence of aortocoronary bypass graft: Secondary | ICD-10-CM | POA: Diagnosis not present

## 2024-02-14 DIAGNOSIS — E7800 Pure hypercholesterolemia, unspecified: Secondary | ICD-10-CM | POA: Diagnosis not present

## 2024-02-14 MED ORDER — NITROGLYCERIN 0.4 MG SL SUBL: 0.4000 mg | SUBLINGUAL_TABLET | SUBLINGUAL | 3 refills | Status: DC | PRN

## 2024-02-14 NOTE — Progress Notes (Signed)
 Cardiology Office Note:    Date:  02/14/2024   ID:  Edward Russo, DOB 02-Jun-1946, MRN [id number]  PCP:  Edward Landau, MD   [facility] Providers Cardiologist:  Edward Delton, MD Electrophysiologist:  Edward Kohler, MD     Referring MD: Edward Landau, MD   Chief Complaint  Patient presents with   other    6 wk f/u c/o light headache. Meds reviewed verbally with pt.    History of Present Illness:    Edward Russo is a 78 y.o. male with a hx of persistent atrial fibrillation, CAD/CABG x 2 in 1996, hypertension, hyperlipidemia, COPD, hard of hearing, presenting for follow-up.  Last seen due to persistent atrial fibrillation, occasional fatigue.  Started on Xarelto , referred to A-fib clinic.  Tolerating Xarelto  with no bleeding issues.  Denies palpitations, syncope.  Has occasional dizziness and lightheadedness.  Has been on Imdur  for over a year and a half.   Prior notes Patient last seen at heart care [place] office, wish to switch to [facility] for easier commute.   He has a history of stable angina managed with beta-blocker and Imdur .   Has been told he has COPD, he denies any history of prior smoking.    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    isosorbide  mononitrate (IMDUR ) 30 MG 24 hr tablet Take 1 tablet (30 mg total) by mouth daily.   metFORMIN  (GLUCOPHAGE ) 850 MG tablet Take 1 tablet by mouth 2 (two) times daily with a meal.    nitroGLYCERIN  (NITROSTAT ) 0.4 MG SL tablet Place 1 tablet (0.4 mg total) under the tongue every 5 (five) minutes as needed for chest pain.   nitroGLYCERIN  (NITROSTAT ) 0.4 MG SL tablet Place 1 tablet (0.4 mg total) under the tongue every 5 (five) minutes as needed for chest pain.   omeprazole (PRILOSEC) 20 MG capsule Take 20 mg by mouth daily.   pravastatin  (PRAVACHOL ) 20 MG tablet Take 1 tablet (20 mg total) by mouth every evening.   rivaroxaban  (XARELTO ) 20 MG TABS tablet Take 1 tablet (20 mg total) by mouth daily with supper.   rivaroxaban  (XARELTO ) 20 MG TABS tablet Take 20 mg by mouth daily with supper.     Allergies:   Lipitor [atorvastatin] and Lisinopril   Social History   Socioeconomic History  Marital status: Married    Spouse name: Not on file   Number of children: Not on file   Years of education: Not on file   Highest education level: Not on file  Occupational History   Not on file  Tobacco Use   Smoking status: Never   Smokeless tobacco: Never  Vaping Use   Vaping status: Never Used  Substance and Sexual Activity   Alcohol use: No   Drug use: No   Sexual activity: Not on file  Other Topics Concern   Not on file  Social History Narrative   Not on file   Social Drivers of Health   Financial Resource Strain: Not on file  Food Insecurity: Not on file   Transportation Needs: Not on file  Physical Activity: Not on file  Stress: Not on file  Social Connections: Not on file     Family History: The patient's family history includes Asthma in his mother; CAD in his father; Heart attack in his brother; Heart failure in his mother.  ROS:   Please see the history of present illness.     All other systems reviewed and are negative.  EKGs/Labs/Other Studies Reviewed:    The following studies were reviewed today:  EKG Interpretation Date/Time:  Monday February 14 2024 11:40:06 EDT Ventricular Rate:  72 PR Interval:    QRS Duration:  88 QT Interval:  380 QTC Calculation: 416 R Axis:   21  Text Interpretation: Atrial fibrillation When compared with ECG of 03-Jan-2024 11:11, No significant change was found Confirmed by Edward Russo ([id number]) on 02/14/2024 11:59:40 AM    Recent Labs: No results found for requested labs within last 365 days.  Recent Lipid Panel    Component Value Date/Time   CHOL 109 05/09/2019 0950   TRIG 94 05/09/2019 0950   HDL 34 (L) 05/09/2019 0950   CHOLHDL 3.2 05/09/2019 0950   CHOLHDL 8.0 10/27/2009 0012   VLDL 36 10/27/2009 0012   LDLCALC 57 05/09/2019 0950     Risk Assessment/Calculations:          Physical Exam:    VS:  BP 124/70 (BP Location: Left Arm, Patient Position: Sitting, Cuff Size: Normal)   Pulse 72   Ht 5' 10 (1.778 m)   Wt 152 lb 3.2 oz (69 kg)   SpO2 98%   BMI 21.84 kg/m     Wt Readings from Last 3 Encounters:  02/14/24 152 lb 3.2 oz (69 kg)  01/03/24 151 lb 12.8 oz (68.9 kg)  12/31/22 158 lb 6.4 oz (71.8 kg)     GEN:  Well nourished, well developed in no acute distress HEENT: Hard of hearing, NECK: No JVD; No carotid bruits CARDIAC: Irregular irregular RESPIRATORY:  Clear to auscultation without rales, wheezing or rhonchi  ABDOMEN: Soft, non-tender, non-distended MUSCULOSKELETAL:  No edema; No deformity  SKIN: Warm and dry NEUROLOGIC:  Alert and oriented x  3 PSYCHIATRIC:  Normal affect   ASSESSMENT:    1. Persistent atrial fibrillation (HCC)   2. S/P CABG x 2   3. Primary hypertension   4. Pure hypercholesterolemia    PLAN:    In order of problems listed above:  Persistent atrial fibrillation, heart rate controlled.  Continue Coreg  3.125 mg twice daily, Xarelto .  Previously referred to A-fib/EP clinic.  Plan DC cardioversion.  CAD/CABG x2 in 1996.  Echo 10/22 EF 60 to 65%.  Continue Xarelto  20 mg daily, Pravachol  20 mg daily Coreg  3.125 mg twice daily, Imdur  30  mg daily.    Hypertension, BP controlled.  Continue Coreg  3.125 mg twice daily, Imdur  30 mg daily.  Consider reducing Imdur  if dizziness/headache persists despite cardioversion/restoration of sinus rhythm. Hyperlipidemia, cholesterol controlled. Continue Pravachol  20 mg daily.    Follow-up in 8 weeks.   Informed Consent   Shared Decision Making/Informed Consent The risks (stroke, cardiac arrhythmias rarely resulting in the need for a temporary or permanent pacemaker, skin irritation or burns and complications associated with conscious sedation including aspiration, arrhythmia, respiratory failure and death), benefits (restoration of normal sinus rhythm) and alternatives of a direct current cardioversion were explained in detail to Mr. Markell and he agrees to proceed.        Medication Adjustments/Labs and Tests Ordered: Current medicines are reviewed at length with the patient today.  Concerns regarding medicines are outlined above.  Orders Placed This Encounter  Procedures   CBC   Basic Metabolic Panel (BMET)   EKG 12-Lead     Meds ordered this encounter  Medications   nitroGLYCERIN  (NITROSTAT ) 0.4 MG SL tablet    Sig: Place 1 tablet (0.4 mg total) under the tongue every 5 (five) minutes as needed for chest pain.    Dispense:  90 tablet    Refill:  3     Patient Instructions  Medication Instructions:  Your physician recommends that you continue on your current  medications as directed. Please refer to the Current Medication list given to you today.   *If you need a refill on your cardiac medications before your next appointment, please call your pharmacy*  Lab Work: Your provider would like for you to have following labs drawn today CBC, BMP If you have labs (blood work) drawn today and your tests are completely normal, you will receive your results only by: MyChart Message (if you have MyChart) OR A paper copy in the mail If you have any lab test that is abnormal or we need to change your treatment, we will call you to review the results.  Testing/Procedures:     Dear Alex Hylan  You are scheduled for a Cardioversion on Friday, June 27 with Dr. Junnie Olives.  Please arrive at the [facility] Entrance of [facility], [address], [place] [address] at 6:30 AM (This is 1 hour(s) prior to your procedure time).  Proceed to the Check-In Desk directly inside the entrance.  Procedure Parking: Use the entrance off of the [address] side of the hospital. Turn right upon entering and follow the driveway to parking that is directly in front of the [facility]. There is no valet parking available at this entrance, however there is an awning directly in front of the [facility]ascular Center for drop off/ pick up for patients.  DIET:  Nothing to eat or drink after midnight except a sip of water with medications (see medication instructions below)  Continue taking your anticoagulant (blood thinner): Rivaroxaban  (Xarelto ).  You will need to continue this after your procedure until you are told by your provider that it is safe to stop.     FYI:  For your safety, and to allow us  to monitor your vital signs accurately during the surgery/procedure we request: If you have artificial nails, gel coating, SNS etc, please have those removed prior to your surgery/procedure. Not having the nail coverings /polish removed may result in  cancellation or delay of your surgery/procedure.  Your support person will be asked to wait in the waiting room during  your procedure.  It is OK to have someone drop you off and come back when you are ready to be discharged.  You cannot drive after the procedure and will need someone to drive you home.  Bring your insurance cards.  *Special Note: Every effort is made to have your procedure done on time. Occasionally there are emergencies that occur at the hospital that may cause delays. Please be patient if a delay does occur.      Follow-Up: At [facility], you and your health needs are our priority.  As part of our continuing mission to provide you with exceptional heart care, our providers are all part of one team.  This team includes your primary Cardiologist (physician) and Advanced Practice Providers or APPs (Physician Assistants and Nurse Practitioners) who all work together to provide you with the care you need, when you need it.  Your next appointment:   2 month(s)  Provider:   You may see Edward Delton, MD or one of the following Advanced Practice Providers on your designated Care Team:   Laneta Pintos, NP Gildardo Labrador, PA-C Varney Gentleman, PA-C Cadence Stratford, PA-C Ronald Cockayne, NP Morey Ar, NP    We recommend signing up for the patient portal called MyChart.  Sign up information is provided on this After Visit Summary.  MyChart is used to connect with patients for Virtual Visits (Telemedicine).  Patients are able to view lab/test results, encounter notes, upcoming appointments, etc.  Non-urgent messages can be sent to your provider as well.   To learn more about what you can do with MyChart, go to [email or web address].         Signed, Edward Delton, MD  02/14/2024 12:51 PM    [facility]

## 2024-02-14 NOTE — Patient Instructions (Addendum)
 Medication Instructions:  Your physician recommends that you continue on your current medications as directed. Please refer to the Current Medication list given to you today.   *If you need a refill on your cardiac medications before your next appointment, please call your pharmacy*  Lab Work: Your provider would like for you to have following labs drawn today CBC, BMP If you have labs (blood work) drawn today and your tests are completely normal, you will receive your results only by: MyChart Message (if you have MyChart) OR A paper copy in the mail If you have any lab test that is abnormal or we need to change your treatment, we will call you to review the results.  Testing/Procedures:     Dear Edward Russo  You are scheduled for a Cardioversion on Friday, June 27 with Dr. Junnie Olives.  Please arrive at the [facility] Entrance of [facility], [address], [place] [address] at 6:30 AM (This is 1 hour(s) prior to your procedure time).  Proceed to the Check-In Desk directly inside the entrance.  Procedure Parking: Use the entrance off of the [address] side of the hospital. Turn right upon entering and follow the driveway to parking that is directly in front of the [facility]. There is no valet parking available at this entrance, however there is an awning directly in front of the [facility] Vascular Center for drop off/ pick up for patients.  DIET:  Nothing to eat or drink after midnight except a sip of water with medications (see medication instructions below)  Continue taking your anticoagulant (blood thinner): Rivaroxaban  (Xarelto ).  You will need to continue this after your procedure until you are told by your provider that it is safe to stop.     FYI:  For your safety, and to allow us  to monitor your vital signs accurately during the surgery/procedure we request: If you have artificial nails, gel coating, SNS etc, please have those removed prior to your  surgery/procedure. Not having the nail coverings /polish removed may result in cancellation or delay of your surgery/procedure.  Your support person will be asked to wait in the waiting room during your procedure.  It is OK to have someone drop you off and come back when you are ready to be discharged.  You cannot drive after the procedure and will need someone to drive you home.  Bring your insurance cards.  *Special Note: Every effort is made to have your procedure done on time. Occasionally there are emergencies that occur at the hospital that may cause delays. Please be patient if a delay does occur.      Follow-Up: At [facility], you and your health needs are our priority.  As part of our continuing mission to provide you with exceptional heart care, our providers are all part of one team.  This team includes your primary Cardiologist (physician) and Advanced Practice Providers or APPs (Physician Assistants and Nurse Practitioners) who all work together to provide you with the care you need, when you need it.  Your next appointment:   2 month(s)  Provider:   You may see Constancia Delton, MD or one of the following Advanced Practice Providers on your designated Care Team:   Laneta Pintos, NP Gildardo Labrador, PA-C Varney Gentleman, PA-C Cadence House, PA-C Ronald Cockayne, NP Morey Ar, NP    We recommend signing up for the patient portal called MyChart.  Sign up information is provided on this  After Visit Summary.  MyChart is used to connect with patients for Virtual Visits (Telemedicine).  Patients are able to view lab/test results, encounter notes, upcoming appointments, etc.  Non-urgent messages can be sent to your provider as well.   To learn more about what you can do with MyChart, go to [email or web address].

## 2024-02-14 NOTE — H&P (View-Only) (Signed)
 Cardiology Office Note:    Date:  02/14/2024   ID:  Edward Russo, DOB 02-Jun-1946, MRN [id number]  PCP:  Glena Landau, MD   [facility] Providers Cardiologist:  Constancia Delton, MD Electrophysiologist:  Ardeen Kohler, MD     Referring MD: Glena Landau, MD   Chief Complaint  Patient presents with   other    6 wk f/u c/o light headache. Meds reviewed verbally with pt.    History of Present Illness:    Edward Russo is a 78 y.o. male with a hx of persistent atrial fibrillation, CAD/CABG x 2 in 1996, hypertension, hyperlipidemia, COPD, hard of hearing, presenting for follow-up.  Last seen due to persistent atrial fibrillation, occasional fatigue.  Started on Xarelto , referred to A-fib clinic.  Tolerating Xarelto  with no bleeding issues.  Denies palpitations, syncope.  Has occasional dizziness and lightheadedness.  Has been on Imdur  for over a year and a half.   Prior notes Patient last seen at heart care [place] office, wish to switch to [facility] for easier commute.   He has a history of stable angina managed with beta-blocker and Imdur .   Has been told he has COPD, he denies any history of prior smoking.    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    isosorbide  mononitrate (IMDUR ) 30 MG 24 hr tablet Take 1 tablet (30 mg total) by mouth daily.   metFORMIN  (GLUCOPHAGE ) 850 MG tablet Take 1 tablet by mouth 2 (two) times daily with a meal.    nitroGLYCERIN  (NITROSTAT ) 0.4 MG SL tablet Place 1 tablet (0.4 mg total) under the tongue every 5 (five) minutes as needed for chest pain.   nitroGLYCERIN  (NITROSTAT ) 0.4 MG SL tablet Place 1 tablet (0.4 mg total) under the tongue every 5 (five) minutes as needed for chest pain.   omeprazole (PRILOSEC) 20 MG capsule Take 20 mg by mouth daily.   pravastatin  (PRAVACHOL ) 20 MG tablet Take 1 tablet (20 mg total) by mouth every evening.   rivaroxaban  (XARELTO ) 20 MG TABS tablet Take 1 tablet (20 mg total) by mouth daily with supper.   rivaroxaban  (XARELTO ) 20 MG TABS tablet Take 20 mg by mouth daily with supper.     Allergies:   Lipitor [atorvastatin] and Lisinopril   Social History   Socioeconomic History  Marital status: Married    Spouse name: Not on file   Number of children: Not on file   Years of education: Not on file   Highest education level: Not on file  Occupational History   Not on file  Tobacco Use   Smoking status: Never   Smokeless tobacco: Never  Vaping Use   Vaping status: Never Used  Substance and Sexual Activity   Alcohol use: No   Drug use: No   Sexual activity: Not on file  Other Topics Concern   Not on file  Social History Narrative   Not on file   Social Drivers of Health   Financial Resource Strain: Not on file  Food Insecurity: Not on file   Transportation Needs: Not on file  Physical Activity: Not on file  Stress: Not on file  Social Connections: Not on file     Family History: The patient's family history includes Asthma in his mother; CAD in his father; Heart attack in his brother; Heart failure in his mother.  ROS:   Please see the history of present illness.     All other systems reviewed and are negative.  EKGs/Labs/Other Studies Reviewed:    The following studies were reviewed today:  EKG Interpretation Date/Time:  Monday February 14 2024 11:40:06 EDT Ventricular Rate:  72 PR Interval:    QRS Duration:  88 QT Interval:  380 QTC Calculation: 416 R Axis:   21  Text Interpretation: Atrial fibrillation When compared with ECG of 03-Jan-2024 11:11, No significant change was found Confirmed by Constancia Delton ([id number]) on 02/14/2024 11:59:40 AM    Recent Labs: No results found for requested labs within last 365 days.  Recent Lipid Panel    Component Value Date/Time   CHOL 109 05/09/2019 0950   TRIG 94 05/09/2019 0950   HDL 34 (L) 05/09/2019 0950   CHOLHDL 3.2 05/09/2019 0950   CHOLHDL 8.0 10/27/2009 0012   VLDL 36 10/27/2009 0012   LDLCALC 57 05/09/2019 0950     Risk Assessment/Calculations:          Physical Exam:    VS:  BP 124/70 (BP Location: Left Arm, Patient Position: Sitting, Cuff Size: Normal)   Pulse 72   Ht 5' 10 (1.778 m)   Wt 152 lb 3.2 oz (69 kg)   SpO2 98%   BMI 21.84 kg/m     Wt Readings from Last 3 Encounters:  02/14/24 152 lb 3.2 oz (69 kg)  01/03/24 151 lb 12.8 oz (68.9 kg)  12/31/22 158 lb 6.4 oz (71.8 kg)     GEN:  Well nourished, well developed in no acute distress HEENT: Hard of hearing, NECK: No JVD; No carotid bruits CARDIAC: Irregular irregular RESPIRATORY:  Clear to auscultation without rales, wheezing or rhonchi  ABDOMEN: Soft, non-tender, non-distended MUSCULOSKELETAL:  No edema; No deformity  SKIN: Warm and dry NEUROLOGIC:  Alert and oriented x  3 PSYCHIATRIC:  Normal affect   ASSESSMENT:    1. Persistent atrial fibrillation (HCC)   2. S/P CABG x 2   3. Primary hypertension   4. Pure hypercholesterolemia    PLAN:    In order of problems listed above:  Persistent atrial fibrillation, heart rate controlled.  Continue Coreg  3.125 mg twice daily, Xarelto .  Previously referred to A-fib/EP clinic.  Plan DC cardioversion.  CAD/CABG x2 in 1996.  Echo 10/22 EF 60 to 65%.  Continue Xarelto  20 mg daily, Pravachol  20 mg daily Coreg  3.125 mg twice daily, Imdur  30  mg daily.    Hypertension, BP controlled.  Continue Coreg  3.125 mg twice daily, Imdur  30 mg daily.  Consider reducing Imdur  if dizziness/headache persists despite cardioversion/restoration of sinus rhythm. Hyperlipidemia, cholesterol controlled. Continue Pravachol  20 mg daily.    Follow-up in 8 weeks.   Informed Consent   Shared Decision Making/Informed Consent The risks (stroke, cardiac arrhythmias rarely resulting in the need for a temporary or permanent pacemaker, skin irritation or burns and complications associated with conscious sedation including aspiration, arrhythmia, respiratory failure and death), benefits (restoration of normal sinus rhythm) and alternatives of a direct current cardioversion were explained in detail to Edward Russo and he agrees to proceed.        Medication Adjustments/Labs and Tests Ordered: Current medicines are reviewed at length with the patient today.  Concerns regarding medicines are outlined above.  Orders Placed This Encounter  Procedures   CBC   Basic Metabolic Panel (BMET)   EKG 12-Lead     Meds ordered this encounter  Medications   nitroGLYCERIN  (NITROSTAT ) 0.4 MG SL tablet    Sig: Place 1 tablet (0.4 mg total) under the tongue every 5 (five) minutes as needed for chest pain.    Dispense:  90 tablet    Refill:  3     Patient Instructions  Medication Instructions:  Your physician recommends that you continue on your current  medications as directed. Please refer to the Current Medication list given to you today.   *If you need a refill on your cardiac medications before your next appointment, please call your pharmacy*  Lab Work: Your provider would like for you to have following labs drawn today CBC, BMP If you have labs (blood work) drawn today and your tests are completely normal, you will receive your results only by: MyChart Message (if you have MyChart) OR A paper copy in the mail If you have any lab test that is abnormal or we need to change your treatment, we will call you to review the results.  Testing/Procedures:     Dear Edward Russo  You are scheduled for a Cardioversion on Friday, June 27 with Dr. Junnie Olives.  Please arrive at the [facility] Entrance of [facility], [address], [place] [address] at 6:30 AM (This is 1 hour(s) prior to your procedure time).  Proceed to the Check-In Desk directly inside the entrance.  Procedure Parking: Use the entrance off of the [address] side of the hospital. Turn right upon entering and follow the driveway to parking that is directly in front of the [facility] Center. There is no valet parking available at this entrance, however there is an awning directly in front of the [facility] Vascular Center for drop off/ pick up for patients.  DIET:  Nothing to eat or drink after midnight except a sip of water with medications (see medication instructions below)  Continue taking your anticoagulant (blood thinner): Rivaroxaban  (Xarelto ).  You will need to continue this after your procedure until you are told by your provider that it is safe to stop.     FYI:  For your safety, and to allow us  to monitor your vital signs accurately during the surgery/procedure we request: If you have artificial nails, gel coating, SNS etc, please have those removed prior to your surgery/procedure. Not having the nail coverings /polish removed may result in  cancellation or delay of your surgery/procedure.  Your support person will be asked to wait in the waiting room during  your procedure.  It is OK to have someone drop you off and come back when you are ready to be discharged.  You cannot drive after the procedure and will need someone to drive you home.  Bring your insurance cards.  *Special Note: Every effort is made to have your procedure done on time. Occasionally there are emergencies that occur at the hospital that may cause delays. Please be patient if a delay does occur.      Follow-Up: At [facility], you and your health needs are our priority.  As part of our continuing mission to provide you with exceptional heart care, our providers are all part of one team.  This team includes your primary Cardiologist (physician) and Advanced Practice Providers or APPs (Physician Assistants and Nurse Practitioners) who all work together to provide you with the care you need, when you need it.  Your next appointment:   2 month(s)  Provider:   You may see Constancia Delton, MD or one of the following Advanced Practice Providers on your designated Care Team:   Laneta Pintos, NP Gildardo Labrador, PA-C Varney Gentleman, PA-C Cadence Stratford, PA-C Ronald Cockayne, NP Morey Ar, NP    We recommend signing up for the patient portal called MyChart.  Sign up information is provided on this After Visit Summary.  MyChart is used to connect with patients for Virtual Visits (Telemedicine).  Patients are able to view lab/test results, encounter notes, upcoming appointments, etc.  Non-urgent messages can be sent to your provider as well.   To learn more about what you can do with MyChart, go to [email or web address].         Signed, Constancia Delton, MD  02/14/2024 12:51 PM    [facility]

## 2024-02-15 LAB — CBC
Hematocrit: 40.3 % (ref 37.5–51.0)
Hemoglobin: 13.2 g/dL (ref 13.0–17.7)
MCH: 32.4 pg (ref 26.6–33.0)
MCHC: 32.8 g/dL (ref 31.5–35.7)
MCV: 99 fL — ABNORMAL HIGH (ref 79–97)
Platelets: 185 10*3/uL (ref 150–450)
RBC: 4.07 x10E6/uL — ABNORMAL LOW (ref 4.14–5.80)
RDW: 13.1 % (ref 11.6–15.4)
WBC: 7.2 10*3/uL (ref 3.4–10.8)

## 2024-02-15 LAB — BASIC METABOLIC PANEL WITH GFR
BUN/Creatinine Ratio: 14 (ref 10–24)
BUN: 15 mg/dL (ref 8–27)
CO2: 20 mmol/L (ref 20–29)
Calcium: 9.3 mg/dL (ref 8.6–10.2)
Chloride: 104 mmol/L (ref 96–106)
Creatinine, Ser: 1.09 mg/dL (ref 0.76–1.27)
Glucose: 91 mg/dL (ref 70–99)
Potassium: 5.6 mmol/L — ABNORMAL HIGH (ref 3.5–5.2)
Sodium: 141 mmol/L (ref 134–144)
eGFR: 70 mL/min/{1.73_m2} (ref >59)

## 2024-02-21 NOTE — Progress Notes (Unsigned)
 Electrophysiology Office Note:   Date:  02/22/2024  ID:  Edward Russo, DOB [birth year], MRN [id number]  Primary Cardiologist: Redell Cave, MD Electrophysiologist: Fonda Kitty, MD      History of Present Illness:   Edward Russo is a 78 y.o. male with h/o persistent atrial fibrillation, CAD/CABG x 2 in 1996, hypertension, hyperlipidemia, COPD who is being seen today for evaluation of his atrial fibrillation at the request of Dr. Cave.   Discussed the use of AI scribe software for clinical note transcription with the patient, who gave verbal consent to proceed.  History of Present Illness Edward Russo is a 78 year old male with atrial fibrillation who presents for evaluation of symptoms and consideration of cardioversion. He was referred by Dr. Cave for evaluation of atrial fibrillation.  He has a history of atrial fibrillation, diagnosed several years ago, and has been experiencing increased fatigue and lightheadedness over the past year. He attributes these symptoms to aging but suspects they are related to his atrial fibrillation. No swelling in the legs is noted or orthopnea.  Annual EKGs have been performed, with the last normal rhythm recorded in May 2024. By May 2025, he was found to be out of rhythm, indicating a change within the past year but uncertain as to the exact timing of his current episode.  He is currently taking Xarelto  to manage the increased risk of stroke associated with atrial fibrillation. He also has a history of coronary artery disease and has undergone bypass surgery in the past.  There is a family history of heart issues on his mother's side.  No new or acute complaints today.   Review of systems complete and found to be negative unless listed in HPI.   EP Information / Studies Reviewed:    EKG is ordered today. Personal review as below.  EKG Interpretation Date/Time:  Tuesday February 22 2024 10:38:07 EDT Ventricular Rate:  74 PR  Interval:    QRS Duration:  84 QT Interval:  352 QTC Calculation: 390 R Axis:   32  Text Interpretation: Atrial fibrillation When compared with ECG of 14-Feb-2024 11:40, No significant change was found Confirmed by Kitty Fonda [phone]) on 02/22/2024 10:00:14 PM   EKG 02/14/24: AF   Echo 06/10/21:   1. Left ventricular ejection fraction, by estimation, is 60 to 65%. Left  ventricular ejection fraction by 2D MOD biplane is 60.6 %. The left  ventricle has normal function. The left ventricle has no regional wall  motion abnormalities. Left ventricular  diastolic parameters are indeterminate.   2. Right ventricular systolic function is normal. The right ventricular  size is not well visualized.   3. Left atrial size was mildly dilated.   4. The mitral valve is normal in structure. Mild mitral valve  regurgitation.   5. The aortic valve was not well visualized. Aortic valve regurgitation  is not visualized.   Nuclear Stress 05/05/2019:  The left ventricular ejection fraction is normal (55-65%). Nuclear stress EF: 59%. No wall motion abnormalities There was no ST segment deviation noted during stress. Defect 1: There is a medium defect of moderate severity present in the basal inferolateral and mid inferolateral location. Findings consistent with prior myocardial infarction with peri-infarct ischemia in the base to mid inferolateral wall segment. This is an intermediate risk study. Prior CABG  Risk Assessment/Calculations:    CHA2DS2-VASc Score = 4   This indicates a 4.8% annual risk of stroke. The patient's score is based upon: CHF History: 0  HTN History: 1 Diabetes History: 0 Stroke History: 0 Vascular Disease History: 1 Age Score: 2 Gender Score: 0             Physical Exam:   VS:  BP 117/78   Pulse 74   Ht 5' 10 (1.778 m)   Wt 150 lb (68 kg)   SpO2 98%   BMI 21.52 kg/m    Wt Readings from Last 3 Encounters:  02/22/24 150 lb (68 kg)  02/14/24 152 lb 3.2 oz (69  kg)  01/03/24 151 lb 12.8 oz (68.9 kg)     GEN: Well nourished, well developed in no acute distress NECK: No JVD CARDIAC: Normal rate, irregular rhythm RESPIRATORY:  Clear to auscultation without rales, wheezing or rhonchi  ABDOMEN: Soft, non-distended EXTREMITIES:  No edema; No deformity   ASSESSMENT AND PLAN:    #. Persistent atrial fibrillation: Believes he is more fatigued in AF, but unsure when he was last in sinus rhythm.  #. Secondary hypercoagulable state due to AF:  - Cardioversion planned for 02/25/24. I will have him follow up with me a few weeks after to assess for symptoms, as well as to see if patient is capable of maintaining sinus.  - Patient would be an appropriate candidate for catheter ablation, if he desires. We will readdress at next visit based on symptomatology associated with AF and burden.  - Continue carvedilol  3.125mg  BID, currently rate controlled.  - Continue Xarelto  20mg  daily.  #. CAD s/p CABG: Denies chest pain.  - Not on aspirin  due to Xarelto .  - Continue Imdur . - Continue pravastain.  #Hypertension -At goal today.  Recommend checking blood pressures 1-2 times per week at home and recording the values.  Recommend bringing these recordings to the primary care physician.   Follow up with Dr. Kennyth in 4 weeks  Signed, Fonda Kennyth, MD

## 2024-02-22 ENCOUNTER — Telehealth: Payer: Self-pay | Admitting: *Deleted

## 2024-02-22 ENCOUNTER — Ambulatory Visit: Attending: Cardiology | Admitting: Cardiology

## 2024-02-22 ENCOUNTER — Encounter: Payer: Self-pay | Admitting: Cardiology

## 2024-02-22 VITALS — BP 117/78 | HR 74 | Ht 70.0 in | Wt 150.0 lb

## 2024-02-22 DIAGNOSIS — I10 Essential (primary) hypertension: Secondary | ICD-10-CM

## 2024-02-22 DIAGNOSIS — I4819 Other persistent atrial fibrillation: Secondary | ICD-10-CM | POA: Diagnosis not present

## 2024-02-22 DIAGNOSIS — D6869 Other thrombophilia: Secondary | ICD-10-CM

## 2024-02-22 DIAGNOSIS — Z951 Presence of aortocoronary bypass graft: Secondary | ICD-10-CM | POA: Diagnosis not present

## 2024-02-22 NOTE — Telephone Encounter (Signed)
 Scheduled for: DCCV Date: 02/25/24 Time: 07:30 am Arrival Time: 06:30 am Location: [facility] Instructions: Reviewed  Patient confirmed time, location, and understood instructions with no further questions.

## 2024-02-22 NOTE — Patient Instructions (Signed)
 Medication Instructions:  Your physician recommends that you continue on your current medications as directed. Please refer to the Current Medication list given to you today.  *If you need a refill on your cardiac medications before your next appointment, please call your pharmacy*  Follow-Up: At [facility], you and your health needs are our priority.  As part of our continuing mission to provide you with exceptional heart care, our providers are all part of one team.  This team includes your primary Cardiologist (physician) and Advanced Practice Providers or APPs (Physician Assistants and Nurse Practitioners) who all work together to provide you with the care you need, when you need it.  Your next appointment:   4 weeks  Provider:   Fonda Kitty, MD

## 2024-02-24 ENCOUNTER — Encounter: Payer: Self-pay | Admitting: Cardiology

## 2024-02-25 ENCOUNTER — Encounter
Admission: RE | Disposition: A | Discharge status: Home / Self Care | Payer: Self-pay | Source: Home / Self Care | Attending: Cardiology

## 2024-02-25 ENCOUNTER — Ambulatory Visit: Admitting: Anesthesiology

## 2024-02-25 ENCOUNTER — Encounter: Payer: Self-pay | Admitting: Cardiology

## 2024-02-25 ENCOUNTER — Ambulatory Visit
Admission: RE | Admit: 2024-02-25 | Discharge: 2024-02-25 | Disposition: A | Discharge status: Home / Self Care | Attending: Cardiology | Admitting: Cardiology

## 2024-02-25 DIAGNOSIS — Z951 Presence of aortocoronary bypass graft: Secondary | ICD-10-CM | POA: Insufficient documentation

## 2024-02-25 DIAGNOSIS — E119 Type 2 diabetes mellitus without complications: Secondary | ICD-10-CM | POA: Diagnosis not present

## 2024-02-25 DIAGNOSIS — I4891 Unspecified atrial fibrillation: Secondary | ICD-10-CM | POA: Diagnosis not present

## 2024-02-25 DIAGNOSIS — I2510 Atherosclerotic heart disease of native coronary artery without angina pectoris: Secondary | ICD-10-CM | POA: Insufficient documentation

## 2024-02-25 DIAGNOSIS — I491 Atrial premature depolarization: Secondary | ICD-10-CM | POA: Diagnosis not present

## 2024-02-25 DIAGNOSIS — I10 Essential (primary) hypertension: Secondary | ICD-10-CM | POA: Diagnosis not present

## 2024-02-25 DIAGNOSIS — J449 Chronic obstructive pulmonary disease, unspecified: Secondary | ICD-10-CM | POA: Diagnosis not present

## 2024-02-25 DIAGNOSIS — I4819 Other persistent atrial fibrillation: Secondary | ICD-10-CM | POA: Diagnosis not present

## 2024-02-25 DIAGNOSIS — Z7901 Long term (current) use of anticoagulants: Secondary | ICD-10-CM | POA: Insufficient documentation

## 2024-02-25 DIAGNOSIS — Z7984 Long term (current) use of oral hypoglycemic drugs: Secondary | ICD-10-CM | POA: Diagnosis not present

## 2024-02-25 DIAGNOSIS — I119 Hypertensive heart disease without heart failure: Secondary | ICD-10-CM | POA: Diagnosis not present

## 2024-02-25 DIAGNOSIS — K219 Gastro-esophageal reflux disease without esophagitis: Secondary | ICD-10-CM | POA: Diagnosis not present

## 2024-02-25 HISTORY — PX: CARDIOVERSION: SHX1299

## 2024-02-25 LAB — GLUCOSE, CAPILLARY: Glucose-Capillary: 112 mg/dL — ABNORMAL HIGH (ref 70–99)

## 2024-02-25 SURGERY — CARDIOVERSION
Anesthesia: General

## 2024-02-25 MED ORDER — PROPOFOL 10 MG/ML IV BOLUS
INTRAVENOUS | Status: DC | PRN
  Administered 2024-02-25: 30 mg via INTRAVENOUS
  Administered 2024-02-25: 10 mg via INTRAVENOUS

## 2024-02-25 MED ORDER — SODIUM CHLORIDE 0.9 % IV SOLN: INTRAVENOUS | Status: DC

## 2024-02-25 MED ORDER — PROPOFOL 10 MG/ML IV BOLUS
INTRAVENOUS | Status: AC
  Filled 2024-02-25: qty 20

## 2024-02-25 NOTE — Procedures (Signed)
 Cardioversion procedure note For atrial fibrillation.  Procedure Details:  Consent: Risks of procedure as well as the alternatives and risks of each were explained to the (patient/caregiver).  Consent for procedure obtained.  Time Out: Verified patient identification, verified procedure, site/side was marked, verified correct patient position, special equipment/implants available, medications/allergies/relevent history reviewed, required imaging and test results available.  Performed  Patient placed on cardiac monitor, pulse oximetry, supplemental oxygen as necessary.   Sedation given: propofol  IV per anesthesia team Pacer pads placed anterior and posterior chest.   Cardioverted 1 time(s).   Cardioverted at  200J. Synchronized biphasic Converted to NSR   Evaluation: Findings: Post procedure EKG shows: NSR Complications: None Patient did tolerate procedure well.  Constancia Delton, M.D.

## 2024-02-25 NOTE — Anesthesia Procedure Notes (Signed)
 Date/Time: 02/25/2024 7:40 AM  Performed by: Dominica Krabbe, CRNAPre-anesthesia Checklist: Patient identified, Emergency Drugs available, Suction available, Patient being monitored and Timeout performed Patient Re-evaluated:Patient Re-evaluated prior to induction Oxygen Delivery Method: Nasal cannula Preoxygenation: Pre-oxygenation with 100% oxygen Induction Type: IV induction

## 2024-02-25 NOTE — Transfer of Care (Signed)
 Immediate Anesthesia Transfer of Care Note  Patient: Edward Russo  Procedure(s) Performed: CARDIOVERSION  Patient Location: cardiac specials  Anesthesia Type:general  Level of Consciousness: awake, alert , and oriented  Airway & Oxygen Therapy: Patient Spontanous Breathing and Patient connected to nasal cannula oxygen  Post-op Assessment: Report given to RN and Post -op Vital signs reviewed and stable  Post vital signs: Reviewed and stable  Last Vitals:  Vitals Value Taken Time  BP 117/77 02/25/24 07:51  Temp    Pulse 77 02/25/24 07:52  Resp 23 02/25/24 07:52  SpO2 98 % 02/25/24 07:52  Vitals shown include unfiled device data.  Last Pain:  Vitals:   02/25/24 0701  TempSrc: Oral  PainSc: 0-No pain         Complications: No notable events documented.

## 2024-02-25 NOTE — Anesthesia Preprocedure Evaluation (Signed)
 Anesthesia Evaluation  Patient identified by MRN, date of birth, ID band Patient awake    Reviewed: Allergy & Precautions, NPO status , Patient's Chart, lab work & pertinent test results, reviewed documented beta blocker date and time   History of Anesthesia Complications Negative for: history of anesthetic complications  Airway Mallampati: III  TM Distance: >3 FB Neck ROM: Full    Dental  (+) Dental Advisory Given, Partial Lower, Partial Upper, Caps,    Pulmonary shortness of breath and with exertion, neg sleep apnea, COPD, Patient abstained from smoking.Not current smoker COPD, never smoked. No inhalers   Pulmonary exam normal breath sounds clear to auscultation       Cardiovascular Exercise Tolerance: Poor METS: < 3 Mets hypertension, Pt. on medications and Pt. on home beta blockers + CAD and + CABG  (-) Past MI + dysrhythmias Atrial Fibrillation  Rhythm:Irregular Rate:Normal  CABG 1999, occasional chest pain and SOB since then but follow regularly with caridology  HLD  Stress test 05/2019:  The left ventricular ejection fraction is normal (55-65%).  Nuclear stress EF: 59%. No wall motion abnormalities  There was no ST segment deviation noted during stress.  Defect 1: There is a medium defect of moderate severity present in the basal inferolateral and mid inferolateral location.  Findings consistent with prior myocardial infarction with peri-infarct ischemia in the base to mid inferolateral wall segment.  This is an intermediate risk study.  Prior CABG   Neuro/Psych negative neurological ROS  negative psych ROS   GI/Hepatic Neg liver ROS,GERD  Medicated and Controlled,,  Endo/Other  diabetes, Well Controlled, Type 2, Oral Hypoglycemic Agents    Renal/GU negative Renal ROS   BPH    Musculoskeletal negative musculoskeletal ROS (+)    Abdominal Normal abdominal exam  (+)   Peds  Hematology negative  hematology ROS (+)   Anesthesia Other Findings Past Medical History: 05/14/2020: Angina pectoris (HCC) 06/12/2015: Aortic ectasia, thoracic (HCC) 11/27/2019: BPH with urinary obstruction 10/04/2017: COPD (chronic obstructive pulmonary disease) (HCC) 06/12/2015: Coronary artery disease involving native coronary artery  of native heart 05/14/2020: Enlarged prostate 10/04/2017: Esophageal reflux 06/12/2015: Essential hypertension 05/14/2020: Hearing loss 06/12/2015: Hyperlipidemia LDL goal <100 No date: Hypertension 05/14/2020: Hypertensive heart disease without congestive heart failure No date: Polio 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: Type 2 diabetes mellitus (HCC)  Reproductive/Obstetrics negative OB ROS                             Anesthesia Physical Anesthesia Plan  ASA: 3  Anesthesia Plan: General   Post-op Pain Management: Minimal or no pain anticipated   Induction: Intravenous  PONV Risk Score and Plan: 2 and Propofol  infusion, TIVA and Ondansetron   Airway Management Planned: Nasal Cannula  Additional Equipment: None  Intra-op Plan:   Post-operative Plan:   Informed Consent: I have reviewed the patients History and Physical, chart, labs and discussed the procedure including the risks, benefits and alternatives for the proposed anesthesia with the patient or authorized representative who has indicated his/her understanding and acceptance.     Dental advisory given  Plan Discussed with: CRNA and Surgeon  Anesthesia Plan Comments: (Discussed risks of anesthesia with patient, including possibility of difficulty with spontaneous ventilation under anesthesia necessitating airway intervention, PONV, and rare risks such as cardiac or respiratory or neurological events, and allergic reactions. Discussed the role of CRNA in patient's perioperative care. Patient understands.)        Anesthesia Quick Evaluation

## 2024-02-25 NOTE — Anesthesia Postprocedure Evaluation (Signed)
 Anesthesia Post Note  Patient: Edward Russo  Procedure(s) Performed: CARDIOVERSION  Patient location during evaluation: Specials Recovery Anesthesia Type: General Level of consciousness: awake and alert Pain management: pain level controlled Vital Signs Assessment: post-procedure vital signs reviewed and stable Respiratory status: spontaneous breathing, nonlabored ventilation, respiratory function stable and patient connected to nasal cannula oxygen Cardiovascular status: blood pressure returned to baseline and stable Postop Assessment: no apparent nausea or vomiting Anesthetic complications: no   No notable events documented.   Last Vitals:  Vitals:   02/25/24 0815 02/25/24 0829  BP: 115/75 117/75  Pulse: 64 75  Resp: (!) 22 19  Temp:  36.5 C  SpO2: 99% 99%    Last Pain:  Vitals:   02/25/24 0829  TempSrc: Tympanic  PainSc: 0-No pain                 Rome Ade

## 2024-02-25 NOTE — Interval H&P Note (Signed)
 History and Physical Interval Note:  02/25/2024 7:47 AM  Edward Russo  has presented today for surgery, with the diagnosis of persistent Afib.  The various methods of treatment have been discussed with the patient and family. After consideration of risks, benefits and other options for treatment, the patient has consented to  Procedure(s): CARDIOVERSION (N/A) as a surgical intervention.  The patient's history has been reviewed, patient examined, no change in status, stable for surgery.  I have reviewed the patient's chart and labs.  Questions were answered to the patient's satisfaction.     [name]

## 2024-02-28 ENCOUNTER — Encounter: Payer: Self-pay | Admitting: Cardiology

## 2024-03-07 ENCOUNTER — Other Ambulatory Visit: Payer: Self-pay | Admitting: *Deleted

## 2024-03-07 ENCOUNTER — Encounter: Payer: Self-pay | Admitting: Cardiology

## 2024-03-07 MED ORDER — ISOSORBIDE MONONITRATE ER 30 MG PO TB24: 30.0000 mg | ORAL_TABLET | Freq: Every day | ORAL | 3 refills | Status: AC

## 2024-03-13 ENCOUNTER — Ambulatory Visit (INDEPENDENT_AMBULATORY_CARE_PROVIDER_SITE_OTHER): Admitting: Family Medicine

## 2024-03-13 ENCOUNTER — Encounter: Payer: Self-pay | Admitting: Family Medicine

## 2024-03-13 VITALS — BP 130/74 | HR 56 | Resp 14 | Ht 70.0 in | Wt 152.2 lb

## 2024-03-13 DIAGNOSIS — Z1159 Encounter for screening for other viral diseases: Secondary | ICD-10-CM

## 2024-03-13 DIAGNOSIS — H900 Conductive hearing loss, bilateral: Secondary | ICD-10-CM | POA: Diagnosis not present

## 2024-03-13 DIAGNOSIS — N401 Enlarged prostate with lower urinary tract symptoms: Secondary | ICD-10-CM | POA: Diagnosis not present

## 2024-03-13 DIAGNOSIS — M545 Low back pain, unspecified: Secondary | ICD-10-CM

## 2024-03-13 DIAGNOSIS — J449 Chronic obstructive pulmonary disease, unspecified: Secondary | ICD-10-CM

## 2024-03-13 DIAGNOSIS — I152 Hypertension secondary to endocrine disorders: Secondary | ICD-10-CM

## 2024-03-13 DIAGNOSIS — G8929 Other chronic pain: Secondary | ICD-10-CM

## 2024-03-13 DIAGNOSIS — G14 Postpolio syndrome: Secondary | ICD-10-CM

## 2024-03-13 DIAGNOSIS — E1159 Type 2 diabetes mellitus with other circulatory complications: Secondary | ICD-10-CM | POA: Diagnosis not present

## 2024-03-13 DIAGNOSIS — E1169 Type 2 diabetes mellitus with other specified complication: Secondary | ICD-10-CM | POA: Diagnosis not present

## 2024-03-13 DIAGNOSIS — K219 Gastro-esophageal reflux disease without esophagitis: Secondary | ICD-10-CM | POA: Diagnosis not present

## 2024-03-13 DIAGNOSIS — R1310 Dysphagia, unspecified: Secondary | ICD-10-CM

## 2024-03-13 DIAGNOSIS — I2510 Atherosclerotic heart disease of native coronary artery without angina pectoris: Secondary | ICD-10-CM

## 2024-03-13 DIAGNOSIS — E785 Hyperlipidemia, unspecified: Secondary | ICD-10-CM | POA: Diagnosis not present

## 2024-03-13 DIAGNOSIS — I4819 Other persistent atrial fibrillation: Secondary | ICD-10-CM

## 2024-03-13 DIAGNOSIS — N138 Other obstructive and reflux uropathy: Secondary | ICD-10-CM

## 2024-03-13 NOTE — Progress Notes (Unsigned)
 New patient visit   Patient: Edward Russo   DOB: Jan 04, 1946   78 y.o. Male  MRN: [id number] Visit Date: 03/13/2024  Today's healthcare provider: Jon Eva, MD   Chief Complaint  Patient presents with  . Establish Care    Previous doctor: Suzen Gentry in [place] at [facility] Last colonoscopy: 12-15 years Last eye exam: Dr. [facility]   Subjective    Edward Russo is a 78 y.o. male who presents today as a new patient to establish care.   Discussed the use of AI scribe software for clinical note transcription with the patient, who gave verbal consent to proceed.  History of Present Illness             Past Medical History:  Diagnosis Date  . Angina pectoris (HCC) 05/14/2020  . Aortic ectasia, thoracic (HCC) 06/12/2015  . BPH with urinary obstruction 11/27/2019  . COPD (chronic obstructive pulmonary disease) (HCC) 10/04/2017  . Coronary artery disease involving native coronary artery of native heart 06/12/2015  . Enlarged prostate 05/14/2020  . Esophageal reflux 10/04/2017  . Essential hypertension 06/12/2015  . Hearing loss 05/14/2020  . Hyperlipidemia LDL goal <100 06/12/2015  . Hypertension   . Hypertensive heart disease without congestive heart failure 05/14/2020  . Polio   . 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 Type 2 diabetes mellitus (HCC) 10/04/2017   Past Surgical History:  Procedure Laterality Date  . BACK SURGERY     lower  . CARDIAC CATHETERIZATION  2011   patent LTA and RTA at cath in 2011   . CARDIOVERSION N/A 02/25/2024   Procedure: CARDIOVERSION;  Surgeon: Darliss Rogue, MD;  Location: [facility] ORS;  Service: Cardiovascular;  Laterality: N/A;  . CORONARY ARTERY BYPASS GRAFT  1996  . TRANSURETHRAL RESECTION OF PROSTATE N/A 11/27/2019   Procedure: TRANSURETHRAL RESECTION OF THE PROSTATE (TURP);  Surgeon: Ottelin, Mark, MD;  Location: [facility];  Service: Urology;  Laterality: N/A;   Family Status  Relation Name Status  .  Mother  (Not Specified)  . Father  (Not Specified)  . Brother  (Not Specified)  No partnership data on file   Family History  Problem Relation Age of Onset  . Asthma Mother   . Heart failure Mother   . CAD Father   . Heart attack Brother    Social History   Socioeconomic History  . Marital status: Married    Spouse name: Not on file  . Number of children: Not on file  . Years of education: Not on file  . Highest education level: Bachelor's degree (e.g., BA, AB, BS)  Occupational History  . Not on file  Tobacco Use  . Smoking status: Never  . Smokeless tobacco: Never  Vaping Use  . Vaping status: Never Used  Substance and Sexual Activity  . Alcohol use: No  . Drug use: No  . Sexual activity: Not on file  Other Topics Concern  . Not on file  Social History Narrative  . Not on file   Social Drivers of Health   Financial Resource Strain: Low Risk  (03/12/2024)   Overall Financial Resource Strain (CARDIA)   . Difficulty of Paying Living Expenses: Not hard at all  Food Insecurity: No Food Insecurity (03/12/2024)   Hunger Vital Sign   . Worried About Programme researcher, broadcasting/film/video in the Last Year: Never true   . Ran Out of Food in the Last Year: Never true  Transportation Needs: No Transportation  Needs (03/12/2024)   PRAPARE - Transportation   . Lack of Transportation (Medical): No   . Lack of Transportation (Non-Medical): No  Physical Activity: Insufficiently Active (03/12/2024)   Exercise Vital Sign   . Days of Exercise per Week: 7 days   . Minutes of Exercise per Session: 20 min  Stress: No Stress Concern Present (03/12/2024)   [employer] of Occupational Health - Occupational Stress Questionnaire   . Feeling of Stress: Not at all  Social Connections: Moderately Isolated (03/12/2024)   Social Connection and Isolation Panel   . Frequency of Communication with Friends and Family: Three times a week   . Frequency of Social Gatherings with Friends and Family: Once a week   .  Attends Religious Services: Patient declined   . Active Member of Clubs or Organizations: No   . Attends Banker Meetings: Not on file   . Marital Status: Married   Outpatient Medications Prior to Visit  Medication Sig  . carvedilol  (COREG ) 3.125 MG tablet Take 1 tablet by mouth 2 (two) times daily.   . isosorbide  mononitrate (IMDUR ) 30 MG 24 hr tablet Take 1 tablet (30 mg total) by mouth daily.  . metFORMIN  (GLUCOPHAGE ) 850 MG tablet Take 1 tablet by mouth 2 (two) times daily with a meal.   . nitroGLYCERIN  (NITROSTAT ) 0.4 MG SL tablet Place 1 tablet (0.4 mg total) under the tongue every 5 (five) minutes as needed for chest pain.  SABRA omeprazole (PRILOSEC) 20 MG capsule Take 20 mg by mouth daily.  . pravastatin  (PRAVACHOL ) 20 MG tablet Take 1 tablet (20 mg total) by mouth every evening.  . rivaroxaban  (XARELTO ) 20 MG TABS tablet Take 1 tablet (20 mg total) by mouth daily with supper.  . acetaminophen  (TYLENOL ) 325 MG tablet Take 325-650 mg by mouth every 6 (six) hours as needed for moderate pain (pain score 4-6).   No facility-administered medications prior to visit.   Allergies  Allergen Reactions  . Lipitor [Atorvastatin] Other (See Comments)    Muscle cramping  . Lisinopril Cough    Review of Systems  {Insert previous labs (optional):23779} {See past labs  Heme  Chem  Endocrine  Serology  Results Review (optional):1}   Objective    BP 130/74 (BP Location: Left Arm, Patient Position: Sitting, Cuff Size: Normal)   Pulse (!) 56   Resp 14   Ht 5' 10 (1.778 m)   Wt 152 lb 3.2 oz (69 kg)   BMI 21.84 kg/m  {Insert last BP/Wt (optional):23777}{See vitals history (optional):1}   Physical Exam Vitals reviewed.  Constitutional:      General: He is not in acute distress.    Appearance: Normal appearance. He is well-developed. He is not diaphoretic.  HENT:     Head: Normocephalic and atraumatic.     Right Ear: Tympanic membrane, ear canal and external ear  normal.     Left Ear: Tympanic membrane, ear canal and external ear normal.     Nose: Nose normal.     Mouth/Throat:     Mouth: Mucous membranes are moist.     Pharynx: Oropharynx is clear. No oropharyngeal exudate.  Eyes:     General: No scleral icterus.    Conjunctiva/sclera: Conjunctivae normal.     Pupils: Pupils are equal, round, and reactive to light.  Neck:     Thyroid: No thyromegaly.  Cardiovascular:     Rate and Rhythm: Normal rate and regular rhythm.     Heart sounds: Normal heart sounds.  No murmur heard. Pulmonary:     Effort: Pulmonary effort is normal. No respiratory distress.     Breath sounds: Normal breath sounds. No wheezing or rales.  Abdominal:     General: There is no distension.     Palpations: Abdomen is soft.     Tenderness: There is no abdominal tenderness.  Musculoskeletal:        General: No deformity.     Cervical back: Neck supple.     Right lower leg: No edema.     Left lower leg: No edema.     Comments: Weakness in RUE, otherwise symmetric  Lymphadenopathy:     Cervical: No cervical adenopathy.  Skin:    General: Skin is warm and dry.     Findings: No rash.  Neurological:     Mental Status: He is alert and oriented to person, place, and time. Mental status is at baseline.     Gait: Gait normal.  Psychiatric:        Mood and Affect: Mood normal.        Behavior: Behavior normal.        Thought Content: Thought content normal.     Depression Screen    03/13/2024    9:57 AM  PHQ 2/9 Scores  PHQ - 2 Score 0  PHQ- 9 Score 1   No results found for any visits on 03/13/24.  Assessment & Plan      Problem List Items Addressed This Visit   None               No follow-ups on file.      Jon Eva, MD  [facility] [phone] (phone) [phone] (fax)  [facility]

## 2024-03-14 DIAGNOSIS — G8929 Other chronic pain: Secondary | ICD-10-CM | POA: Insufficient documentation

## 2024-03-14 LAB — LIPID PANEL
Chol/HDL Ratio: 3.3 ratio (ref 0.0–5.0)
Cholesterol, Total: 118 mg/dL (ref 100–199)
HDL: 36 mg/dL — ABNORMAL LOW (ref >39)
LDL Chol Calc (NIH): 64 mg/dL (ref 0–99)
Triglycerides: 92 mg/dL (ref 0–149)
VLDL Cholesterol Cal: 18 mg/dL (ref 5–40)

## 2024-03-14 LAB — HEMOGLOBIN A1C
Est. average glucose Bld gHb Est-mCnc: 111 mg/dL
Hgb A1c MFr Bld: 5.5 % (ref 4.8–5.6)

## 2024-03-14 LAB — COMPREHENSIVE METABOLIC PANEL WITH GFR
ALT: 14 IU/L (ref 0–44)
AST: 15 IU/L (ref 0–40)
Albumin: 4.5 g/dL (ref 3.8–4.8)
Alkaline Phosphatase: 54 IU/L (ref 44–121)
BUN/Creatinine Ratio: 17 (ref 10–24)
BUN: 17 mg/dL (ref 8–27)
Bilirubin Total: 0.7 mg/dL (ref 0.0–1.2)
CO2: 21 mmol/L (ref 20–29)
Calcium: 9.5 mg/dL (ref 8.6–10.2)
Chloride: 103 mmol/L (ref 96–106)
Creatinine, Ser: 1.03 mg/dL (ref 0.76–1.27)
Globulin, Total: 2.2 g/dL (ref 1.5–4.5)
Glucose: 101 mg/dL — ABNORMAL HIGH (ref 70–99)
Potassium: 5.2 mmol/L (ref 3.5–5.2)
Sodium: 143 mmol/L (ref 134–144)
Total Protein: 6.7 g/dL (ref 6.0–8.5)
eGFR: 75 mL/min/1.73 (ref >59)

## 2024-03-14 LAB — MICROALBUMIN / CREATININE URINE RATIO
Creatinine, Urine: 122.8 mg/dL
Microalb/Creat Ratio: 9 mg/g{creat} (ref 0–29)
Microalbumin, Urine: 11.4 ug/mL

## 2024-03-14 NOTE — Assessment & Plan Note (Signed)
 Benign prostatic hyperplasia, status post TURP. No current issues reported.

## 2024-03-14 NOTE — Assessment & Plan Note (Signed)
 Hearing loss with hearing aids, impacted by cerumen and Eustachian tube dysfunction Hearing loss managed with hearing aids. Impacted by cerumen and Eustachian tube dysfunction causing intermittent hearing changes. - Recommend use of Flonase nasal spray to alleviate Eustachian tube dysfunction. - Suggest Debrox for cerumen management.

## 2024-03-14 NOTE — Assessment & Plan Note (Signed)
 Hyperlipidemia managed with pravastatin . No recent cholesterol levels available. - Order lipid panel to assess current cholesterol levels.

## 2024-03-14 NOTE — Assessment & Plan Note (Signed)
 Gastroesophageal reflux disease well-controlled with omeprazole.  Will continue

## 2024-03-14 NOTE — Assessment & Plan Note (Signed)
 Blood pressure well-controlled on current regimen.  Continue current meds.  Recheck metabolic panel

## 2024-03-14 NOTE — Assessment & Plan Note (Signed)
 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

## 2024-03-14 NOTE — Assessment & Plan Note (Signed)
 Type 2 diabetes managed with metformin . No recent A1c or urine microalbumin tests noted. - Order A1c and urine microalbumin tests. - Plan follow-up in six months to review diabetes management.

## 2024-03-14 NOTE — Assessment & Plan Note (Signed)
 Chronic obstructive pulmonary disease diagnosed previously, no current symptoms or treatment required.

## 2024-03-14 NOTE — Assessment & Plan Note (Signed)
 Coronary artery disease, status post CABG. Managed with carvedilol , isosorbide , and nitroglycerin  as needed. Followed by cardiology Denies any angina or dyspnea today

## 2024-03-14 NOTE — Assessment & Plan Note (Signed)
 Atrial fibrillation, status post cardioversion Atrial fibrillation, status post successful cardioversion. Currently on Xarelto  for anticoagulation. Followed by cardiology

## 2024-03-14 NOTE — Assessment & Plan Note (Signed)
 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 No neurological deficits noted in lower extremities. - Discuss potential benefits of physical therapy for back pain management. - Consider referral to physical therapy if interested.

## 2024-03-15 ENCOUNTER — Ambulatory Visit: Payer: Self-pay | Admitting: Family Medicine

## 2024-03-20 ENCOUNTER — Other Ambulatory Visit: Payer: Self-pay | Admitting: Family Medicine

## 2024-03-21 ENCOUNTER — Encounter: Payer: Self-pay | Admitting: Cardiology

## 2024-03-21 ENCOUNTER — Ambulatory Visit: Attending: Cardiology | Admitting: Cardiology

## 2024-03-21 VITALS — BP 123/61 | HR 59 | Ht 70.0 in | Wt 153.6 lb

## 2024-03-21 DIAGNOSIS — Z951 Presence of aortocoronary bypass graft: Secondary | ICD-10-CM | POA: Diagnosis not present

## 2024-03-21 DIAGNOSIS — I4819 Other persistent atrial fibrillation: Secondary | ICD-10-CM | POA: Diagnosis not present

## 2024-03-21 DIAGNOSIS — D6869 Other thrombophilia: Secondary | ICD-10-CM | POA: Diagnosis not present

## 2024-03-21 DIAGNOSIS — I10 Essential (primary) hypertension: Secondary | ICD-10-CM | POA: Diagnosis not present

## 2024-03-21 NOTE — Progress Notes (Unsigned)
 Electrophysiology Office Note:   Date:  03/22/2024  ID:  Edward Russo, DOB [birth year], MRN [id number]  Primary Cardiologist: Redell Cave, MD Electrophysiologist: Fonda Kitty, MD      History of Present Illness:   Edward Russo is a 78 y.o. male with h/o persistent atrial fibrillation, CAD/CABG x 2 in 1996, hypertension, hyperlipidemia, COPD who is being seen today for evaluation of his atrial fibrillation at the request of Dr. Cave.   Discussed the use of AI scribe software for clinical note transcription with the patient, who gave verbal consent to proceed.  History of Present Illness Edward Russo is a 78 year old male with atrial fibrillation who presents for follow-up after cardioversion.  He underwent cardioversion for atrial fibrillation but has not noticed a significant change in how he feels. He continues to experience moments of tiredness. No significant chest pain is noted. He is currently on Xarelto  to minimize the risk of stroke associated with atrial fibrillation. His medication regimen also includes carvedilol  for blood pressure management. He has a history of coronary artery disease and underwent bypass surgery in 1996.  He is otherwise doing relatively well and has no new or acute complaints today.  Review of systems complete and found to be negative unless listed in HPI.   EP Information / Studies Reviewed:    EKG is ordered today. Personal review as below.  EKG Interpretation Date/Time:  Tuesday March 21 2024 14:05:52 EDT Ventricular Rate:  59 PR Interval:  138 QRS Duration:  74 QT Interval:  410 QTC Calculation: 405 R Axis:   22  Text Interpretation: Sinus bradycardia with Premature supraventricular complexes Nonspecific ST and T wave abnormality When compared with ECG of 25-Feb-2024 07:46, PREVIOUS ECG IS PRESENT Confirmed by Kitty Fonda [phone]) on 03/21/2024 2:09:52 PM   EKG 02/14/24: AF   Echo 06/10/21:   1. Left ventricular ejection  fraction, by estimation, is 60 to 65%. Left  ventricular ejection fraction by 2D MOD biplane is 60.6 %. The left  ventricle has normal function. The left ventricle has no regional wall  motion abnormalities. Left ventricular  diastolic parameters are indeterminate.   2. Right ventricular systolic function is normal. The right ventricular  size is not well visualized.   3. Left atrial size was mildly dilated.   4. The mitral valve is normal in structure. Mild mitral valve  regurgitation.   5. The aortic valve was not well visualized. Aortic valve regurgitation  is not visualized.   Nuclear Stress 05/05/2019:  The left ventricular ejection fraction is normal (55-65%). Nuclear stress EF: 59%. No wall motion abnormalities There was no ST segment deviation noted during stress. Defect 1: There is a medium defect of moderate severity present in the basal inferolateral and mid inferolateral location. Findings consistent with prior myocardial infarction with peri-infarct ischemia in the base to mid inferolateral wall segment. This is an intermediate risk study. Prior CABG  Risk Assessment/Calculations:    CHA2DS2-VASc Score = 4   This indicates a 4.8% annual risk of stroke. The patient's score is based upon: CHF History: 0 HTN History: 1 Diabetes History: 0 Stroke History: 0 Vascular Disease History: 1 Age Score: 2 Gender Score: 0             Physical Exam:   VS:  BP 123/61 (BP Location: Left Arm, Patient Position: Sitting, Cuff Size: Normal)   Pulse (!) 59   Ht 5' 10 (1.778 m)   Wt 153 lb 9.6 oz (69.7  kg)   SpO2 97%   BMI 22.04 kg/m    Wt Readings from Last 3 Encounters:  03/21/24 153 lb 9.6 oz (69.7 kg)  03/13/24 152 lb 3.2 oz (69 kg)  02/25/24 150 lb (68 kg)     GEN: Well nourished, well developed in no acute distress NECK: No JVD CARDIAC: Normal rate, regular rhythm RESPIRATORY:  Clear to auscultation without rales, wheezing or rhonchi  ABDOMEN: Soft,  non-distended EXTREMITIES:  No edema; No deformity   ASSESSMENT AND PLAN:    #. Persistent atrial fibrillation: He is maintaining sinus rhythm since cardioversion.  Unsure if he has had significant change in how he feels overall. #. Secondary hypercoagulable state due to AF:  -We discussed multiple options regarding long-term management of his atrial fibrillation.  Those options include continuing carvedilol  and Xarelto  for now and simply monitoring for recurrence, which I suspect he will have at some point.  Other options would include rhythm control strategy in efforts to minimize progression to permanent atrial fibrillation in the future.  We discussed that permanent atrial fibrillation is associated with higher risk of stroke, dementia, heart failure.  With regards to rhythm control strategy, antiarrhythmic drug therapy or catheter ablation would be appropriate.  If he chooses to pursue antiarrhythmic drug therapy, then would recommend starting with dronedarone 400 mg twice daily in the presence of his coronary disease.  If he were to choose catheter ablation, then I think he would be an appropriate candidate for this.  He would like to think about his options and will let us  know if he desires to pursue any intervention. - Continue carvedilol  3.125mg  BID.  When in AF, he was rate controlled with this minimal dose. - Continue Xarelto  20mg  daily.  #. CAD s/p CABG: Denies chest pain.  - Not on aspirin  due to Xarelto .  - Continue Imdur . - Continue pravastain.  #Hypertension -At goal today.  Recommend checking blood pressures 1-2 times per week at home and recording the values.  Recommend bringing these recordings to the primary care physician.  Follow-up with the EP APP in 6 months.  Signed, Fonda Kitty, MD

## 2024-03-21 NOTE — Patient Instructions (Signed)
 Medication Instructions:  Your physician recommends that you continue on your current medications as directed. Please refer to the Current Medication list given to you today.  *If you need a refill on your cardiac medications before your next appointment, please call your pharmacy*  Lab Work: None ordered   Testing/Procedures: None ordered  Follow-Up: At [facility], you and your health needs are our priority.  As part of our continuing mission to provide you with exceptional heart care, our providers are all part of one team.  This team includes your primary Cardiologist (physician) and Advanced Practice Providers or APPs (Physician Assistants and Nurse Practitioners) who all work together to provide you with the care you need, when you need it.  Your next appointment:   6 month(s)  Provider:   Suzann Riddle, NP     Thank you for choosing [facility]!!     Other Instructions  Dronedarone Tablets What is this medication? DRONEDARONE (droe NE da rone) treats a fast or irregular heartbeat (arrhythmia). It works by slowing down overactive electric signals in the heart, which stabilizes your heart rhythm. It belongs to a group of medications called antiarrhythmics. This medicine may be used for other purposes; ask your health care provider or pharmacist if you have questions. COMMON BRAND NAME(S): Multaq What should I tell my care team before I take this medication? They need to know if you have any of these conditions: Heart failure History of stroke Irregular heartbeat or rhythm other than atrial fibrillation (AFib) Liver disease Lung disease Low levels of magnesium in the blood Low levels of potassium in the blood Other heart conditions, such as heart block Pacemaker Permanent atrial fibrillation (AFib) Slow heartbeat An unusual or allergic reaction to dronedarone, other medications, foods, dyes, or preservatives Pregnant or trying to get  pregnant Breastfeeding How should I use this medication? Take this medication by mouth with a glass of water. Follow the directions on the prescription label. Take one tablet with the morning meal and one tablet with the evening meal. Do not take your medication more often than directed. Do not stop taking except on the advice of your care team. A special MedGuide will be given to you by the pharmacist with each prescription and refill. Be sure to read this information carefully each time. Talk to your care team about the use of this medication in children. Special care may be needed. Overdosage: If you think you have taken too much of this medicine contact a poison control center or emergency room at once. NOTE: This medicine is only for you. Do not share this medicine with others. What if I miss a dose? If you miss a dose, take it as soon as you can. If it is almost time for your next dose, take only that dose. Do not take double or extra doses. What may interact with this medication? Do not take this medication with any of the following: Adagrasib Arsenic trioxide Certain antibiotics, such as clarithromycin, erythromycin, pentamidine, telithromycin, troleandomycin Certain medications for depression, such as tricyclic antidepressants Certain medications for fungal infections, such as fluconazole, itraconazole, ketoconazole, posaconazole, voriconazole Certain medications for irregular heart beat, such as amiodarone, disopyramide, flecainide, ibutilide, quinidine, propafenone, sotalol Certain medications for malaria, such as chloroquine, halofantrine Cisapride Cyclosporine Droperidol Haloperidol Methadone Nefazodone Other medications that cause heart rhythm changes, such as degarelix, encorafenib, entrectinib, eribulin, goserelin, lapatinib Phenothiazines, such as chlorpromazine, mesoridazine, prochlorperazine, thioridazine Pimozide Ritonavir Ziprasidone This medication may also  interact with the following: Certain  medications for blood pressure, heart disease, or irregular heart beat, such as diltiazem, metoprolol, propranolol, verapamil Certain medications for cholesterol, such as atorvastatin, lovastatin, simvastatin Certain medications for seizures, such as carbamazepine, phenobarbital, phenytoin Dabigatran Digoxin Dofetilide Grapefruit juice Rifampin Sirolimus St. John's wort Tacrolimus Warfarin This list may not describe all possible interactions. Give your health care provider a list of all the medicines, herbs, non-prescription drugs, or dietary supplements you use. Also tell them if you smoke, drink alcohol, or use illegal drugs. Some items may interact with your medicine. What should I watch for while using this medication? Visit your care team for regular checks on your progress. Tell your care team if your symptoms do not start to get better or if they get worse. This medication may affect your coordination, reaction time, or judgment. Do not drive or operate machinery until you know how this medication affects you. Sit up or stand slowly to reduce the risk of dizzy or fainting spells. Talk to your care team if you may be pregnant. Serious birth defects can occur if you take this medication during pregnancy and for 5 days after the last dose. You will need a negative pregnancy test before starting this medication. Contraception is recommended while taking this medication and for 5 days after the last dose. You care team can help you find the option that works for you. Do not breastfeed while taking this medication and for 5 days after the last dose. What side effects may I notice from receiving this medication? Side effects that you should report to your care team as soon as possible: Allergic reactions--skin rash, itching, hives, swelling of the face, lips, tongue, or throat Slow heartbeat--dizziness, feeling faint or lightheaded, confusion, trouble  breathing, unusual weakness or fatigue Heart failure--shortness of breath, swelling of the ankles, feet, or hands, sudden weight gain, unusual weakness or fatigue Heart rhythm changes--fast or irregular heartbeat, dizziness, feeling faint or lightheaded, chest pain, trouble breathing Kidney injury--decrease in the amount of urine, swelling of the ankles, hands, or feet Liver injury--right upper belly pain, loss of appetite, nausea, light-colored stool, dark yellow or brown urine, yellowing skin or eyes, unusual weakness or fatigue Lung injury--shortness of breath or trouble breathing, cough, spitting up blood, chest pain, fever Side effects that usually do not require medical attention (report to your care team if they continue or are bothersome): Diarrhea Nausea Stomach pain Vomiting This list may not describe all possible side effects. Call your doctor for medical advice about side effects. You may report side effects to FDA at 1-800-FDA-1088. Where should I keep my medication? Keep out of the reach of children. Store at room temperature between 15 and 30 degrees C (59 and 86 degrees F). Throw away any unused medication after the expiration date. NOTE: This sheet is a summary. It may not cover all possible information. If you have questions about this medicine, talk to your doctor, pharmacist, or health care provider.  2024 Elsevier/Gold Standard (2022-06-24 00:00:00)    Cardiac Ablation Cardiac ablation is a procedure to destroy (ablate) heart tissue that is sending bad signals. These bad signals cause the heart to beat very fast or in a way that is not normal. Destroying some tissues can help make the heart rhythm normal. Tell your doctor about: Any allergies you have. All medicines you are taking. These include vitamins, herbs, eye drops, creams, and over-the-counter medicines. Any problems you or family members have had with anesthesia. Any bleeding problems you have. Any surgeries  you have had. Any medical conditions you have. Whether you are pregnant or may be pregnant. What are the risks? Your doctor will talk with you about risks. These may include: Infection. Bruising and bleeding. Stroke or blood clots. Damage to nearby areas of your body. Allergies to medicines or dyes. Needing a pacemaker if the heart gets damaged. A pacemaker helps the heart beat normally. The procedure not working. What happens before the procedure? Medicines Ask your doctor about changing or stopping: Your normal medicines. Vitamins, herbs, and supplements. Over-the-counter medicines. Do not take aspirin  or ibuprofen unless you are told to. General instructions Follow instructions from your doctor about what you may eat and drink. If you will be going home right after the procedure, plan to have a responsible adult: Take you home from the hospital or clinic. You will not be allowed to drive. Care for you for the time you are told. Ask your doctor what steps will be taken to prevent the spread of germs. What happens during the procedure?  An IV tube will be put into one of your veins. You may be given: A sedative. This helps you relax. Anesthesia. This will: Numb certain areas of your body. The skin on your neck or groin will be numbed. A cut (incision) will be made in your neck or groin. A needle will be put through the cut and into a large vein. The small, thin tube (catheter) will be put into the needle. The tube will be moved to your heart. A type of X-ray (fluoroscopy) will be used to help guide the tube. It will also show constant images of the heart on a screen. Dye may be put through the tube. This helps your doctor see your heart. An electric current will be sent from the tube to destroy heart tissue in certain areas. The tube will be taken out. Pressure will be held on your cut. This helps stop bleeding. A bandage (dressing) will be put over your cut. The procedure  may vary among doctors and hospitals. What happens after the procedure? You will be monitored until you leave the hospital or clinic. This includes checking your blood pressure, heart rate and rhythm, breathing rate, and blood oxygen level. Your cut will be checked for bleeding. You will need to lie still for a few hours. If your groin was used, you will need to keep your leg straight for a few hours after the small, thin tube is removed. This information is not intended to replace advice given to you by your health care provider. Make sure you discuss any questions you have with your health care provider. Document Revised: 02/03/2022 Document Reviewed: 02/03/2022 Elsevier Patient Education  2024 [employer].

## 2024-04-11 DIAGNOSIS — H6123 Impacted cerumen, bilateral: Secondary | ICD-10-CM | POA: Diagnosis not present

## 2024-04-11 DIAGNOSIS — H60331 Swimmer's ear, right ear: Secondary | ICD-10-CM | POA: Diagnosis not present

## 2024-04-19 ENCOUNTER — Encounter: Payer: Self-pay | Admitting: Cardiology

## 2024-04-19 ENCOUNTER — Ambulatory Visit: Attending: Cardiology | Admitting: Cardiology

## 2024-04-19 VITALS — BP 125/80 | HR 62 | Ht 70.0 in | Wt 147.4 lb

## 2024-04-19 DIAGNOSIS — Z951 Presence of aortocoronary bypass graft: Secondary | ICD-10-CM

## 2024-04-19 DIAGNOSIS — E7800 Pure hypercholesterolemia, unspecified: Secondary | ICD-10-CM

## 2024-04-19 DIAGNOSIS — I10 Essential (primary) hypertension: Secondary | ICD-10-CM | POA: Diagnosis not present

## 2024-04-19 DIAGNOSIS — I4819 Other persistent atrial fibrillation: Secondary | ICD-10-CM

## 2024-04-19 NOTE — Progress Notes (Signed)
 Cardiology Office Note:    Date:  04/19/2024   ID:  Edward BOK, DOB 1946-04-09, MRN [id number]  PCP:  Myrla Jon HERO, MD   [facility] Providers Cardiologist:  Redell Cave, MD Electrophysiologist:  Fonda Kitty, MD     Referring MD: Loreli Kins, MD   Chief Complaint  Patient presents with   Follow-up    2 month follow up visit. Patient is doing good on today. Meds reviewed.     History of Present Illness:    Edward Russo is a 78 y.o. male with a hx of persistent atrial fibrillation s/p DC cardioversion 6/25, CAD/CABG x 2 in 1996, hypertension, hyperlipidemia, COPD, hard of hearing, presenting for follow-up.  Last seen due to persistent atrial fibrillation, underwent DC cardioversion successfully.  Maintaining sinus rhythm.  Compliant with Xarelto  as prescribed, no bleeding issues.  Feels well, no concerns at this time.   Prior notes Patient last seen at heart care [place] office, wish to switch to [facility] for easier commute.   He has a history of stable angina managed with beta-blocker and Imdur .   Has been told he has COPD, he denies any history of prior smoking.    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   Type 2 diabetes mellitus (HCC) 10/04/2017    Past Surgical History:  Procedure Laterality Date   BACK SURGERY  2002   lower   CARDIAC CATHETERIZATION  2011   patent LTA and RTA at cath in  2011    CARDIOVERSION N/A 02/25/2024   Procedure: CARDIOVERSION;  Surgeon: Cave Redell, MD;  Location: [facility] ORS;  Service: Cardiovascular;  Laterality: N/A;   CORONARY ARTERY BYPASS GRAFT  1996   TRANSURETHRAL RESECTION OF PROSTATE N/A 11/27/2019   Procedure: TRANSURETHRAL RESECTION OF THE PROSTATE (TURP);  Surgeon: Ottelin, Mark, MD;  Location: [facility];  Service: Urology;  Laterality: N/A;    Current Medications: Current Meds  Medication Sig   carvedilol  (COREG ) 3.125 MG tablet Take 1 tablet by mouth 2 (two) times daily.    ciprofloxacin -dexamethasone  (CIPRODEX) OTIC suspension 4 drops 2 (two) times daily.   isosorbide  mononitrate (IMDUR ) 30 MG 24 hr tablet Take 1 tablet (30 mg total) by mouth daily.   metFORMIN  (GLUCOPHAGE ) 850 MG tablet Take 1 tablet by mouth 2 (two) times daily with a meal.    nitroGLYCERIN  (NITROSTAT ) 0.4 MG SL tablet Place 1 tablet (0.4 mg total) under the tongue every 5 (five) minutes as needed for chest pain.   omeprazole (PRILOSEC) 20 MG capsule TAKE 1 CAPSULE BY MOUTH DAILY   pravastatin  (PRAVACHOL ) 20 MG tablet Take 1 tablet (20 mg total) by mouth every evening.   rivaroxaban  (XARELTO ) 20 MG TABS tablet Take 1 tablet (20 mg total) by mouth daily with supper.     Allergies:   Lipitor [atorvastatin] and Lisinopril   Social History   Socioeconomic History   Marital status: Married    Spouse name: Not on  file   Number of children: Not on file   Years of education: Not on file   Highest education level: Bachelor's degree (e.g., BA, AB, BS)  Occupational History   Occupation: retired Airline pilot  Tobacco Use   Smoking status: Never   Smokeless tobacco: Never  Vaping Use   Vaping status: Never Used  Substance and Sexual Activity   Alcohol use: No   Drug use: No   Sexual activity: Not on file  Other Topics Concern   Not on file  Social History Narrative   Not on file   Social Drivers of Health   Financial Resource Strain:  Low Risk  (03/12/2024)   Overall Financial Resource Strain (CARDIA)    Difficulty of Paying Living Expenses: Not hard at all  Food Insecurity: No Food Insecurity (03/12/2024)   Hunger Vital Sign    Worried About Running Out of Food in the Last Year: Never true    Ran Out of Food in the Last Year: Never true  Transportation Needs: No Transportation Needs (03/12/2024)   PRAPARE - Administrator, Civil Service (Medical): No    Lack of Transportation (Non-Medical): No  Physical Activity: Insufficiently Active (03/12/2024)   Exercise Vital Sign    Days of Exercise per Week: 7 days    Minutes of Exercise per Session: 20 min  Stress: No Stress Concern Present (03/12/2024)   [employer] of Occupational Health - Occupational Stress Questionnaire    Feeling of Stress: Not at all  Social Connections: Moderately Isolated (03/12/2024)   Social Connection and Isolation Panel    Frequency of Communication with Friends and Family: Three times a week    Frequency of Social Gatherings with Friends and Family: Once a week    Attends Religious Services: Patient declined    Database administrator or Organizations: No    Attends Engineer, structural: Not on file    Marital Status: Married     Family History: The patient's family history includes Asthma in his mother; CAD in his father; Heart attack in his brother; Heart disease in his paternal grandmother; Heart failure in his mother; Lung cancer in his maternal uncle.  ROS:   Please see the history of present illness.     All other systems reviewed and are negative.  EKGs/Labs/Other Studies Reviewed:    The following studies were reviewed today:  EKG Interpretation Date/Time:  Wednesday April 19 2024 11:42:49 EDT Ventricular Rate:  62 PR Interval:  110 QRS Duration:  82 QT Interval:  392 QTC Calculation: 397 R Axis:   -16  Text Interpretation: Sinus rhythm with short PR with Sinus arrhythmia Low voltage QRS Possible  Inferior infarct , age undetermined Confirmed by Darliss Rogue ([id number]) on 04/19/2024 11:47:30 AM    Recent Labs: 02/14/2024: Hemoglobin 13.2; Platelets 185 03/13/2024: ALT 14; BUN 17; Creatinine, Ser 1.03; Potassium 5.2; Sodium 143  Recent Lipid Panel    Component Value Date/Time   CHOL 118 03/13/2024 1100   TRIG 92 03/13/2024 1100   HDL 36 (L) 03/13/2024 1100   CHOLHDL 3.3 03/13/2024 1100   CHOLHDL 8.0 10/27/2009 0012   VLDL 36 10/27/2009 0012   LDLCALC 64 03/13/2024 1100     Risk Assessment/Calculations:          Physical Exam:    VS:  BP 125/80   Pulse 62   Ht 5' 10 (1.778 m)   Wt 147 lb 6.4 oz (66.9 kg)   SpO2 96%  BMI 21.15 kg/m     Wt Readings from Last 3 Encounters:  04/19/24 147 lb 6.4 oz (66.9 kg)  03/21/24 153 lb 9.6 oz (69.7 kg)  03/13/24 152 lb 3.2 oz (69 kg)     GEN:  Well nourished, well developed in no acute distress HEENT: Hard of hearing, NECK: No JVD; No carotid bruits CARDIAC: Regular rate and rhythm RESPIRATORY:  Clear to auscultation without rales, wheezing or rhonchi  ABDOMEN: Soft, non-tender, non-distended MUSCULOSKELETAL:  No edema; No deformity  SKIN: Warm and dry NEUROLOGIC:  Alert and oriented x 3 PSYCHIATRIC:  Normal affect   ASSESSMENT:    1. Persistent atrial fibrillation (HCC)   2. S/P CABG x 2   3. Primary hypertension   4. Pure hypercholesterolemia    PLAN:    In order of problems listed above:  Persistent atrial fibrillation s/p DCCV 01/2024.  Maintaining sinus rhythm. Continue Coreg  3.125 mg twice daily, Xarelto  20 mg daily.   CAD/CABG x2 in 1996.  Echo 10/22 EF 60 to 65%.  Continue Xarelto  20 mg daily, Pravachol  20 mg daily Coreg  3.125 mg twice daily, Imdur  30 mg daily.    Hypertension, BP controlled.  Continue Coreg  3.125 mg twice daily, Imdur  30 mg daily.  Consider reducing Imdur  if dizziness/headache persists despite cardioversion/restoration of sinus rhythm. Hyperlipidemia, cholesterol controlled. Continue  Pravachol  20 mg daily.    Follow-up in 12 months.      Medication Adjustments/Labs and Tests Ordered: Current medicines are reviewed at length with the patient today.  Concerns regarding medicines are outlined above.  Orders Placed This Encounter  Procedures   EKG 12-Lead     No orders of the defined types were placed in this encounter.    Patient Instructions  Medication Instructions:  Your physician recommends that you continue on your current medications as directed. Please refer to the Current Medication list given to you today.   *If you need a refill on your cardiac medications before your next appointment, please call your pharmacy*  Lab Work: No labs ordered today  If you have labs (blood work) drawn today and your tests are completely normal, you will receive your results only by: MyChart Message (if you have MyChart) OR A paper copy in the mail If you have any lab test that is abnormal or we need to change your treatment, we will call you to review the results.  Testing/Procedures: No test ordered today   Follow-Up: At [facility], you and your health needs are our priority.  As part of our continuing mission to provide you with exceptional heart care, our providers are all part of one team.  This team includes your primary Cardiologist (physician) and Advanced Practice Providers or APPs (Physician Assistants and Nurse Practitioners) who all work together to provide you with the care you need, when you need it.  Your next appointment:   1 year(s)  Provider:   You may see Redell Cave, MD or one of the following Advanced Practice Providers on your designated Care Team:   Lonni Meager, NP Lesley Maffucci, PA-C Bernardino Bring, PA-C Cadence Elwood, PA-C Tylene Lunch, NP Barnie Hila, NP    We recommend signing up for the patient portal called MyChart.  Sign up information is provided on this After Visit Summary.  MyChart is used to connect with  patients for Virtual Visits (Telemedicine).  Patients are able to view lab/test results, encounter notes, upcoming appointments, etc.  Non-urgent messages can be sent to your provider  as well.   To learn more about what you can do with MyChart, go to [email or web address].      Signed, Redell Cave, MD  04/19/2024 12:13 PM    [facility]

## 2024-04-19 NOTE — Patient Instructions (Signed)

## 2024-04-25 DIAGNOSIS — H60331 Swimmer's ear, right ear: Secondary | ICD-10-CM | POA: Diagnosis not present

## 2024-04-29 ENCOUNTER — Other Ambulatory Visit: Payer: Self-pay | Admitting: Cardiology

## 2024-05-24 ENCOUNTER — Other Ambulatory Visit: Payer: Self-pay | Admitting: Cardiology

## 2024-06-07 ENCOUNTER — Other Ambulatory Visit: Payer: Self-pay | Admitting: Cardiology

## 2024-06-09 NOTE — Telephone Encounter (Signed)
 Pt of Dr. Darliss. Does Dr. Darliss want to refill this RX? Please advise

## 2024-06-13 ENCOUNTER — Other Ambulatory Visit: Payer: Self-pay | Admitting: Family Medicine

## 2024-06-13 DIAGNOSIS — E1169 Type 2 diabetes mellitus with other specified complication: Secondary | ICD-10-CM

## 2024-06-13 NOTE — Telephone Encounter (Signed)
 Needs to be filled by primary provider.

## 2024-06-13 NOTE — Telephone Encounter (Unsigned)
 Copied from CRM [id number]. Topic: Clinical - Medication Refill >> Jun 13, 2024  9:23 AM Amy B wrote: Medication:  metFORMIN  (GLUCOPHAGE ) 850 MG tablet  Has the patient contacted their pharmacy? Yes (Agent: If no, request that the patient contact the pharmacy for the refill. If patient does not wish to contact the pharmacy document the reason why and proceed with request.) (Agent: If yes, when and what did the pharmacy advise?)  This is the patient's preferred pharmacy:  [facility] - [place], [place] - [address] [place] [address] Phone: [phone] Fax: [phone]  Is this the correct pharmacy for this prescription? Yes If no, delete pharmacy and type the correct one.   Has the prescription been filled recently? No  Is the patient out of the medication? Yes  Has the patient been seen for an appointment in the last year OR does the patient have an upcoming appointment? Yes  Can we respond through MyChart? Yes  Agent: Please be advised that Rx refills may take up to 3 business days. We ask that you follow-up with your pharmacy.

## 2024-06-14 ENCOUNTER — Other Ambulatory Visit: Payer: Self-pay | Admitting: Family Medicine

## 2024-06-14 NOTE — Telephone Encounter (Signed)
 Copied from CRM [id number]. Topic: Clinical - Medication Refill >> Jun 14, 2024  3:00 PM Shardie S wrote: Medication: metFORMIN  (GLUCOPHAGE ) 850 MG tablet  Has the patient contacted their pharmacy? Yes (Agent: If no, request that the patient contact the pharmacy for the refill. If patient does not wish to contact the pharmacy document the reason why and proceed with request.) (Agent: If yes, when and what did the pharmacy advise?)  This is the patient's preferred pharmacy:  [facility] - [place], [place] - [address] [place] [address] Phone: [phone] Fax: [phone]  Is this the correct pharmacy for this prescription? Yes If no, delete pharmacy and type the correct one.   Has the prescription been filled recently? No  Is the patient out of the medication? No  Has the patient been seen for an appointment in the last year OR does the patient have an upcoming appointment? Yes  Can we respond through MyChart? No  Agent: Please be advised that Rx refills may take up to 3 business days. We ask that you follow-up with your pharmacy.

## 2024-06-15 MED ORDER — METFORMIN HCL 850 MG PO TABS: 850.0000 mg | ORAL_TABLET | Freq: Two times a day (BID) | ORAL | 1 refills | Status: DC

## 2024-06-15 NOTE — Telephone Encounter (Signed)
 Requested medication (s) are due for refill today - unsure  Requested medication (s) are on the active medication list -yes  Future visit scheduled -yes  Last refill: unsure  Notes to clinic: listed as historical - sent for provider review   Requested Prescriptions  Pending Prescriptions Disp Refills   metFORMIN  (GLUCOPHAGE ) 850 MG tablet      Sig: Take 1 tablet (850 mg total) by mouth 2 (two) times daily with a meal.     Endocrinology:  Diabetes - Biguanides Failed - 06/15/2024 11:54 AM      Failed - B12 Level in normal range and within 720 days    No results found for: VITAMINB12       Failed - CBC within normal limits and completed in the last 12 months    WBC  Date Value Ref Range Status  02/14/2024 7.2 3.4 - 10.8 x10E3/uL Final  08/14/2019 11.4 (H) 4.0 - 10.5 K/uL Final   RBC  Date Value Ref Range Status  02/14/2024 4.07 (L) 4.14 - 5.80 x10E6/uL Final  08/14/2019 4.61 4.22 - 5.81 MIL/uL Final   Hemoglobin  Date Value Ref Range Status  02/14/2024 13.2 13.0 - 17.7 g/dL Final   Hematocrit  Date Value Ref Range Status  02/14/2024 40.3 37.5 - 51.0 % Final   MCHC  Date Value Ref Range Status  02/14/2024 32.8 31.5 - 35.7 g/dL Final  87/85/7979 65.5 30.0 - 36.0 g/dL Final   [facility]  Date Value Ref Range Status  02/14/2024 32.4 26.6 - 33.0 pg Final  08/14/2019 31.0 26.0 - 34.0 pg Final   MCV  Date Value Ref Range Status  02/14/2024 99 (H) 79 - 97 fL Final   No results found for: PLTCOUNTKUC, LABPLAT, POCPLA RDW  Date Value Ref Range Status  02/14/2024 13.1 11.6 - 15.4 % Final         Passed - Cr in normal range and within 360 days    Creatinine, Ser  Date Value Ref Range Status  03/13/2024 1.03 0.76 - 1.27 mg/dL Final         Passed - HBA1C is between 0 and 7.9 and within 180 days    Hgb A1c MFr Bld  Date Value Ref Range Status  03/13/2024 5.5 4.8 - 5.6 % Final    Comment:             Prediabetes: 5.7 - 6.4          Diabetes: >6.4           Glycemic control for adults with diabetes: <7.0          Passed - eGFR in normal range and within 360 days    GFR calc Af Amer  Date Value Ref Range Status  08/14/2019 >60 >60 mL/min Final   GFR calc non Af Amer  Date Value Ref Range Status  08/14/2019 >60 >60 mL/min Final   eGFR  Date Value Ref Range Status  03/13/2024 75 >59 mL/min/1.73 Final         Passed - Valid encounter within last 6 months    Recent Outpatient Visits           3 months ago Hypertension associated with diabetes [facility])   [place] [facility] Bacigalupo, Jon HERO, MD       Future Appointments             In 3 months Bacigalupo, Jon HERO, MD [facility], [place]  Requested Prescriptions  Pending Prescriptions Disp Refills   metFORMIN  (GLUCOPHAGE ) 850 MG tablet      Sig: Take 1 tablet (850 mg total) by mouth 2 (two) times daily with a meal.     Endocrinology:  Diabetes - Biguanides Failed - 06/15/2024 11:54 AM      Failed - B12 Level in normal range and within 720 days    No results found for: VITAMINB12       Failed - CBC within normal limits and completed in the last 12 months    WBC  Date Value Ref Range Status  02/14/2024 7.2 3.4 - 10.8 x10E3/uL Final  08/14/2019 11.4 (H) 4.0 - 10.5 K/uL Final   RBC  Date Value Ref Range Status  02/14/2024 4.07 (L) 4.14 - 5.80 x10E6/uL Final  08/14/2019 4.61 4.22 - 5.81 MIL/uL Final   Hemoglobin  Date Value Ref Range Status  02/14/2024 13.2 13.0 - 17.7 g/dL Final   Hematocrit  Date Value Ref Range Status  02/14/2024 40.3 37.5 - 51.0 % Final   MCHC  Date Value Ref Range Status  02/14/2024 32.8 31.5 - 35.7 g/dL Final  87/85/7979 65.5 30.0 - 36.0 g/dL Final   [facility]  Date Value Ref Range Status  02/14/2024 32.4 26.6 - 33.0 pg Final  08/14/2019 31.0 26.0 - 34.0 pg Final   MCV  Date Value Ref Range Status  02/14/2024 99 (H) 79 - 97 fL Final   No results found for:  PLTCOUNTKUC, LABPLAT, POCPLA RDW  Date Value Ref Range Status  02/14/2024 13.1 11.6 - 15.4 % Final         Passed - Cr in normal range and within 360 days    Creatinine, Ser  Date Value Ref Range Status  03/13/2024 1.03 0.76 - 1.27 mg/dL Final         Passed - HBA1C is between 0 and 7.9 and within 180 days    Hgb A1c MFr Bld  Date Value Ref Range Status  03/13/2024 5.5 4.8 - 5.6 % Final    Comment:             Prediabetes: 5.7 - 6.4          Diabetes: >6.4          Glycemic control for adults with diabetes: <7.0          Passed - eGFR in normal range and within 360 days    GFR calc Af Amer  Date Value Ref Range Status  08/14/2019 >60 >60 mL/min Final   GFR calc non Af Amer  Date Value Ref Range Status  08/14/2019 >60 >60 mL/min Final   eGFR  Date Value Ref Range Status  03/13/2024 75 >59 mL/min/1.73 Final         Passed - Valid encounter within last 6 months    Recent Outpatient Visits           3 months ago Hypertension associated with diabetes [facility])   [place] [facility] Bacigalupo, Jon HERO, MD       Future Appointments             In 3 months Bacigalupo, Jon HERO, MD [facility], [place]

## 2024-06-16 NOTE — Telephone Encounter (Signed)
 Requested Prescriptions  Refused Prescriptions Disp Refills   metFORMIN  (GLUCOPHAGE ) 850 MG tablet      Sig: Take 1 tablet (850 mg total) by mouth 2 (two) times daily with a meal.     Endocrinology:  Diabetes - Biguanides Failed - 06/16/2024 11:48 AM      Failed - B12 Level in normal range and within 720 days    No results found for: VITAMINB12       Failed - CBC within normal limits and completed in the last 12 months    WBC  Date Value Ref Range Status  02/14/2024 7.2 3.4 - 10.8 x10E3/uL Final  08/14/2019 11.4 (H) 4.0 - 10.5 K/uL Final   RBC  Date Value Ref Range Status  02/14/2024 4.07 (L) 4.14 - 5.80 x10E6/uL Final  08/14/2019 4.61 4.22 - 5.81 MIL/uL Final   Hemoglobin  Date Value Ref Range Status  02/14/2024 13.2 13.0 - 17.7 g/dL Final   Hematocrit  Date Value Ref Range Status  02/14/2024 40.3 37.5 - 51.0 % Final   MCHC  Date Value Ref Range Status  02/14/2024 32.8 31.5 - 35.7 g/dL Final  87/85/7979 65.5 30.0 - 36.0 g/dL Final   [facility]  Date Value Ref Range Status  02/14/2024 32.4 26.6 - 33.0 pg Final  08/14/2019 31.0 26.0 - 34.0 pg Final   MCV  Date Value Ref Range Status  02/14/2024 99 (H) 79 - 97 fL Final   No results found for: PLTCOUNTKUC, LABPLAT, POCPLA RDW  Date Value Ref Range Status  02/14/2024 13.1 11.6 - 15.4 % Final         Passed - Cr in normal range and within 360 days    Creatinine, Ser  Date Value Ref Range Status  03/13/2024 1.03 0.76 - 1.27 mg/dL Final         Passed - HBA1C is between 0 and 7.9 and within 180 days    Hgb A1c MFr Bld  Date Value Ref Range Status  03/13/2024 5.5 4.8 - 5.6 % Final    Comment:             Prediabetes: 5.7 - 6.4          Diabetes: >6.4          Glycemic control for adults with diabetes: <7.0          Passed - eGFR in normal range and within 360 days    GFR calc Af Amer  Date Value Ref Range Status  08/14/2019 >60 >60 mL/min Final   GFR calc non Af Amer  Date Value Ref Range Status   08/14/2019 >60 >60 mL/min Final   eGFR  Date Value Ref Range Status  03/13/2024 75 >59 mL/min/1.73 Final         Passed - Valid encounter within last 6 months    Recent Outpatient Visits           3 months ago Hypertension associated with diabetes [facility])   [place] [facility] Bacigalupo, Jon HERO, MD       Future Appointments             In 3 months Bacigalupo, Jon HERO, MD [facility], [place]

## 2024-06-19 DIAGNOSIS — H40013 Open angle with borderline findings, low risk, bilateral: Secondary | ICD-10-CM | POA: Diagnosis not present

## 2024-06-19 DIAGNOSIS — Z7984 Long term (current) use of oral hypoglycemic drugs: Secondary | ICD-10-CM | POA: Diagnosis not present

## 2024-06-19 DIAGNOSIS — H2513 Age-related nuclear cataract, bilateral: Secondary | ICD-10-CM | POA: Diagnosis not present

## 2024-06-19 DIAGNOSIS — E119 Type 2 diabetes mellitus without complications: Secondary | ICD-10-CM | POA: Diagnosis not present

## 2024-06-19 DIAGNOSIS — H1131 Conjunctival hemorrhage, right eye: Secondary | ICD-10-CM | POA: Diagnosis not present

## 2024-06-22 ENCOUNTER — Encounter: Payer: Self-pay | Admitting: Cardiology

## 2024-09-14 ENCOUNTER — Encounter: Payer: Self-pay | Admitting: Family Medicine

## 2024-09-14 ENCOUNTER — Ambulatory Visit (INDEPENDENT_AMBULATORY_CARE_PROVIDER_SITE_OTHER): Admitting: Family Medicine

## 2024-09-14 VITALS — BP 120/71 | HR 66 | Ht 70.0 in | Wt 145.0 lb

## 2024-09-14 DIAGNOSIS — E1159 Type 2 diabetes mellitus with other circulatory complications: Secondary | ICD-10-CM | POA: Diagnosis not present

## 2024-09-14 DIAGNOSIS — H900 Conductive hearing loss, bilateral: Secondary | ICD-10-CM | POA: Diagnosis not present

## 2024-09-14 DIAGNOSIS — M545 Low back pain, unspecified: Secondary | ICD-10-CM

## 2024-09-14 DIAGNOSIS — G14 Postpolio syndrome: Secondary | ICD-10-CM

## 2024-09-14 DIAGNOSIS — E1169 Type 2 diabetes mellitus with other specified complication: Secondary | ICD-10-CM | POA: Diagnosis not present

## 2024-09-14 DIAGNOSIS — I152 Hypertension secondary to endocrine disorders: Secondary | ICD-10-CM | POA: Diagnosis not present

## 2024-09-14 DIAGNOSIS — K4090 Unilateral inguinal hernia, without obstruction or gangrene, not specified as recurrent: Secondary | ICD-10-CM | POA: Diagnosis not present

## 2024-09-14 DIAGNOSIS — Z0001 Encounter for general adult medical examination with abnormal findings: Secondary | ICD-10-CM | POA: Diagnosis not present

## 2024-09-14 DIAGNOSIS — Z0000 Encounter for general adult medical examination without abnormal findings: Secondary | ICD-10-CM

## 2024-09-14 DIAGNOSIS — E785 Hyperlipidemia, unspecified: Secondary | ICD-10-CM

## 2024-09-14 DIAGNOSIS — J449 Chronic obstructive pulmonary disease, unspecified: Secondary | ICD-10-CM | POA: Diagnosis not present

## 2024-09-14 DIAGNOSIS — G8929 Other chronic pain: Secondary | ICD-10-CM | POA: Diagnosis not present

## 2024-09-14 DIAGNOSIS — I4819 Other persistent atrial fibrillation: Secondary | ICD-10-CM

## 2024-09-14 NOTE — Assessment & Plan Note (Signed)
 Hyperlipidemia associated with type 2 diabetes mellitus. Labs to be drawn today to monitor cholesterol levels. - Ordered cholesterol labs

## 2024-09-14 NOTE — Patient Instructions (Signed)
 Consider asking at the pharmacy about the tetanus shot (TDAP) and the shingles shot (Shingrix)  ?

## 2024-09-14 NOTE — Assessment & Plan Note (Signed)
 Physical therapy referral includes this diagnosis for comprehensive management. 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

## 2024-09-14 NOTE — Assessment & Plan Note (Signed)
 Previous recommendation for physical therapy. Referral to hospital's rehab department for physical therapy services. Physical therapy will help maintain muscle mass and improve strength and endurance. - Referred to hospital's rehab department for physical therapy

## 2024-09-14 NOTE — Assessment & Plan Note (Signed)
 Type 2 diabetes mellitus with complications. Labs to be drawn today to monitor blood sugar levels and kidney function. - Ordered A1c and urine test for diabetes management

## 2024-09-14 NOTE — Assessment & Plan Note (Signed)
 Chronic obstructive pulmonary disease diagnosed previously, no current symptoms or treatment required.

## 2024-09-14 NOTE — Assessment & Plan Note (Signed)
 Well controlled Continue current medications Recheck metabolic panel

## 2024-09-14 NOTE — Assessment & Plan Note (Signed)
 Small left inguinal hernia, reducible, with no significant pain. Hernia is common in the inguinal canal due to natural anatomical opening. Discussed surgical repair options and monitoring for changes. He prefers to monitor for now. - Monitor hernia for changes in size, pain, or reducibility - Will consider surgical consultation if hernia worsens

## 2024-09-14 NOTE — Assessment & Plan Note (Signed)
 Upcoming cardiology appointment scheduled for EKG and review. Blood counts to be checked due to Xarelto  use. - Ordered blood counts for cardiology review - Attend cardiology appointment on January 23rd

## 2024-09-14 NOTE — Assessment & Plan Note (Signed)
 F/b ENT

## 2024-09-14 NOTE — Progress Notes (Signed)
 "  Annual Wellness Visit   Patient: Edward Russo, Male    DOB: March 23, 1946, 79 y.o.   MRN: [id number]  Chief Complaint  Patient presents with   Annual Exam    Last completed 03/09/23 Diet -  regular  Exercise - walks dog 15-20 minutes every morning and then once more during the day Feeling - fairly well Sleeping - well Concerns -  possible hernia. Reports bulged when standing up and if he sits back or lays down its not as prominate. First noticed 6 months ago and no difference in size and no pain.     Subjective:    Edward Russo is a 79 y.o. male who presents today for his Annual Wellness Visit.   HPI  Discussed the use of AI scribe software for clinical note transcription with the patient, who gave verbal consent to proceed.  History of Present Illness   Edward Russo is a 79 year old male who presents for a wellness visit and evaluation of a possible hernia. He is accompanied by his wife.  He reports a bulge in the inguinal region that appears when standing and is about the size of an egg. It reduces when sitting. He has no significant pain but notes a mild ache by the end of more active days.  He has chronic back pain and would like a referral for physical therapy, preferably at the hospitals rehab department.  He has had gradual weight loss over several years, from a usual 150 lb to 140 lb. Since starting one Ensure High Protein daily in October, he has gained about 5 lb. He had recent gastrointestinal upset after eating restaurant food and stopped Ensure during that episode.  He uses hearing aids but hears better when he pushes them deeper into his ears and is planning ENT evaluation.  He is due for an eye exam and plans to schedule this to maintain regular vision care.           Medications: Show/hide medication list[1]  Allergies[2]  Patient Care Team: Myrla Jon HERO, MD as PCP - General (Family Medicine) Darliss Rogue, MD as PCP - Cardiology  (Cardiology) Kennyth Chew, MD as PCP - Electrophysiology (Cardiology)  ROS   Objective:  Objective  BP 120/71 (BP Location: Left Arm, Patient Position: Sitting, Cuff Size: Normal)   Pulse 66   Ht 5' 10 (1.778 m)   Wt 145 lb (65.8 kg)   SpO2 100%   BMI 20.81 kg/m    Physical Exam Vitals reviewed.  Constitutional:      General: He is not in acute distress.    Appearance: Normal appearance. He is well-developed. He is not diaphoretic.  HENT:     Head: Normocephalic and atraumatic.     Right Ear: Tympanic membrane, ear canal and external ear normal.     Left Ear: Tympanic membrane, ear canal and external ear normal.     Nose: Nose normal.     Mouth/Throat:     Mouth: Mucous membranes are moist.     Pharynx: Oropharynx is clear. No oropharyngeal exudate.  Eyes:     General: No scleral icterus.    Conjunctiva/sclera: Conjunctivae normal.     Pupils: Pupils are equal, round, and reactive to light.  Neck:     Thyroid: No thyromegaly.  Cardiovascular:     Rate and Rhythm: Normal rate. Rhythm irregularly irregular.     Pulses: Normal pulses.     Heart sounds: Normal heart sounds. No  murmur heard. Pulmonary:     Effort: Pulmonary effort is normal. No respiratory distress.     Breath sounds: Normal breath sounds. No wheezing or rales.  Abdominal:     General: There is no distension.     Palpations: Abdomen is soft.     Tenderness: There is no abdominal tenderness.     Hernia: A hernia is present.  Musculoskeletal:        General: No deformity.     Cervical back: Neck supple.     Right lower leg: No edema.     Left lower leg: No edema.  Lymphadenopathy:     Cervical: No cervical adenopathy.  Skin:    General: Skin is warm and dry.     Findings: No rash.  Neurological:     Mental Status: He is alert and oriented to person, place, and time. Mental status is at baseline.     Gait: Gait normal.  Psychiatric:        Mood and Affect: Mood normal.        Behavior:  Behavior normal.        Thought Content: Thought content normal.        Most recent depression screenings:    09/14/2024   10:01 AM 03/13/2024    9:57 AM  PHQ 2/9 Scores  PHQ - 2 Score 0 0  PHQ- 9 Score  1      Data saved with a previous flowsheet row definition    Visit info / Clinical Intake: Medicare Wellness Visit Type:: Subsequent Annual Wellness Visit Persons participating in visit and providing information:: patient Medicare Wellness Visit Mode:: In-person (required for WTM) Interpreter Needed?: No Pre-visit prep was completed: yes AWV questionnaire completed by patient prior to visit?: no Living arrangements:: lives with spouse/significant other Patient's Overall Health Status Rating: very good Typical amount of pain: some Does pain affect daily life?: no Are you currently prescribed opioids?: no  Dietary Habits and Nutritional Risks How many meals a day?: 2 Eats fruit and vegetables daily?: (!) no Most meals are obtained by: preparing own meals In the last 2 weeks, have you had any of the following?: (!) nausea, vomiting, diarrhea Diabetic:: (!) yes Any non-healing wounds?: no How often do you check your BS?: as needed Would you like to be referred to a Nutritionist or for Diabetic Management? : no  Functional Status Activities of Daily Living (to include ambulation/medication): Independent Ambulation: Independent Medication Administration: Independent Home Management (perform basic housework or laundry): Independent Manage your own finances?: yes Primary transportation is: driving Concerns about vision?: no *vision screening is required for WTM* Concerns about hearing?: (!) yes Uses hearing aids?: (!) yes Hear whispered voice?: (!) no *in-person visit only*  Fall Screening Falls in the past year?: 0 Number of falls in past year: 0 Was there an injury with Fall?: 0 Fall Risk Category Calculator: 0 Patient Fall Risk Level: Low Fall Risk  Fall  Risk Patient at Risk for Falls Due to: No Fall Risks Fall risk Follow up: Falls evaluation completed  Home and Transportation Safety: All rugs have non-skid backing?: N/A, no rugs All stairs or steps have railings?: yes Grab bars in the bathtub or shower?: (!) no Have non-skid surface in bathtub or shower?: (!) no Good home lighting?: yes Regular seat belt use?: yes Hospital stays in the last year:: (!) yes How many hospital stays:: 1 Reason: Procedure  Cognitive Assessment Difficulty concentrating, remembering, or making decisions? : no Will 6CIT or  Mini Cog be Completed: no 6CIT or Mini Cog Declined: patient alert, oriented, able to answer questions appropriately and recall recent events  Advance Directives (For Healthcare) Does Patient Have a Medical Advance Directive?: Yes Does patient want to make changes to medical advance directive?: Yes (ED - send information to MyChart) Type of Advance Directive: Healthcare Power of [place]; Living will Copy of Healthcare Power of Attorney in Chart?: No - copy requested Copy of Living Will in Chart?: No - copy requested  Reviewed/Updated  Reviewed/Updated: Reviewed All (Medical, Surgical, Family, Medications, Allergies, Care Teams, Patient Goals)    Vision/Hearing Screen: No results found.   No results found for any visits on 09/14/24.   Assessment & Plan:    Annual wellness visit done today including the all of the following: Reviewed patient's Family and Medical History Reviewed and updated list of patient's medical providers Assessment of cognitive impairment was done Assessed patient's functional ability Established a written schedule for health screening services Health Risk Assessment Completed and Reviewed  Exercise Activities and Dietary recommendations  Goals   None     Immunization History  Administered Date(s) Administered   INFLUENZA, HIGH DOSE SEASONAL PF 06/30/2023, 07/26/2024   Influenza-Unspecified  06/22/2012, 07/17/2013, 08/01/2014, 05/26/2016, 07/16/2017, 07/18/2018, 08/02/2019   Moderna Sars-Covid-2 Vaccination 03/05/2020, 04/03/2020   Pneumococcal Conjugate-13 08/01/2014   Pneumococcal Polysaccharide-23 06/22/2012   Td 04/01/1998   Tdap 05/28/2011    Health Maintenance  Topic Date Due   Hepatitis C Screening  Never done   Zoster Vaccines- Shingrix (1 of 2) Never done   DTaP/Tdap/Td (3 - Td or Tdap) 05/27/2021   COVID-19 Vaccine (3 - 2025-26 season) 05/01/2024   OPHTHALMOLOGY EXAM  06/13/2024   Diabetic kidney evaluation - Urine ACR  09/13/2024   HEMOGLOBIN A1C  09/13/2024   Diabetic kidney evaluation - eGFR measurement  03/13/2025   FOOT EXAM  09/14/2025   Medicare Annual Wellness (AWV)  09/14/2025   Pneumococcal Vaccine: 50+ Years  Completed   Influenza Vaccine  Completed   Meningococcal B Vaccine  Aged Out    Discussed health benefits of physical activity, and encouraged him to engage in regular exercise appropriate for his age and condition.    Problem List Items Addressed This Visit       Cardiovascular and Mediastinum   Hypertension associated with diabetes (HCC)   Well controlled Continue current medications Recheck metabolic panel      Relevant Orders   Comprehensive metabolic panel with GFR   Persistent atrial fibrillation [facility])   Upcoming cardiology appointment scheduled for EKG and review. Blood counts to be checked due to Xarelto  use. - Ordered blood counts for cardiology review - Attend cardiology appointment on January 23rd      Relevant Orders   CBC w/Diff/Platelet     Respiratory   COPD (chronic obstructive pulmonary disease) (HCC)   Chronic obstructive pulmonary disease diagnosed previously, no current symptoms or treatment required.        Endocrine   Hyperlipidemia associated with type 2 diabetes mellitus (HCC)   Hyperlipidemia associated with type 2 diabetes mellitus. Labs to be drawn today to monitor cholesterol levels. - Ordered  cholesterol labs      Relevant Orders   Comprehensive metabolic panel with GFR   Lipid panel   Type 2 diabetes mellitus (HCC)   Type 2 diabetes mellitus with complications. Labs to be drawn today to monitor blood sugar levels and kidney function. 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 Referral to hospital's rehab department for physical therapy services. Physical therapy will help maintain muscle mass and improve strength and endurance. - Referred to hospital's rehab department for physical therapy      Relevant Orders   Ambulatory referral to Physical Therapy   Left inguinal hernia   Small left inguinal hernia, reducible, with no significant pain. Hernia is common in the inguinal canal due to natural anatomical opening. Discussed surgical repair options and monitoring for changes. He prefers to monitor for now. - Monitor hernia for changes in size, pain, or reducibility - Will consider surgical consultation if hernia worsens      Other Visit Diagnoses       Encounter for annual wellness visit (AWV) in Medicare patient    -  Primary     Encounter for annual physical exam       Relevant Orders   Hemoglobin A1c   Comprehensive metabolic panel with GFR   Microalbumin / creatinine urine ratio   Lipid panel   CBC w/Diff/Platelet           Adult Wellness Visit Routine wellness visit conducted. Blood pressure is well-controlled. Weight has decreased slightly over the years, but recent gain of five pounds noted. High  protein intake with Ensure started in October. - Ordered labs: cholesterol, kidney and liver function, A1c, and urine test for diabetes - Performed diabetic foot exam - Discussed TDAP and shingles vaccines at pharmacy - Scheduled follow-up in six months       Return in about 6 months (around 03/14/2025) for chronic disease f/u.     Jon Eva, MD [facility]         [1]  Outpatient Medications Prior to Visit  Medication Sig   carvedilol  (COREG ) 3.125 MG tablet Take 1 tablet (3.125 mg total) by mouth 2 (two) times daily.   isosorbide  mononitrate (IMDUR ) 30 MG 24 hr tablet Take 1 tablet (30 mg total) by mouth daily.   metFORMIN  (GLUCOPHAGE ) 850 MG tablet Take 1 tablet (850 mg total) by mouth 2 (two) times daily with a meal.   nitroGLYCERIN  (NITROSTAT ) 0.4 MG SL tablet Place 1 tablet (0.4 mg total) under the tongue every 5 (five) minutes as needed for chest pain.   omeprazole (PRILOSEC) 20 MG capsule TAKE 1 CAPSULE BY MOUTH DAILY   pravastatin  (PRAVACHOL ) 20 MG tablet TAKE ONE TABLET EVERY EVENING   rivaroxaban  (XARELTO ) 20 MG TABS tablet TAKE ONE TABLET BY MOUTH ONCE DAILY WITHSUPPER   No facility-administered medications prior to visit.  [2]  Allergies Allergen Reactions   Lipitor [Atorvastatin] Other (See Comments)    Muscle cramping   Lisinopril Cough   "

## 2024-09-15 ENCOUNTER — Other Ambulatory Visit: Payer: Self-pay | Admitting: Family Medicine

## 2024-09-15 DIAGNOSIS — E1169 Type 2 diabetes mellitus with other specified complication: Secondary | ICD-10-CM

## 2024-09-15 LAB — CBC WITH DIFFERENTIAL/PLATELET
Basophils Absolute: 0.1 x10E3/uL (ref 0.0–0.2)
Basos: 1 %
EOS (ABSOLUTE): 0.1 x10E3/uL (ref 0.0–0.4)
Eos: 1 %
Hematocrit: 44.7 % (ref 37.5–51.0)
Hemoglobin: 14.7 g/dL (ref 13.0–17.7)
Immature Grans (Abs): 0 x10E3/uL (ref 0.0–0.1)
Immature Granulocytes: 0 %
Lymphocytes Absolute: 2.4 x10E3/uL (ref 0.7–3.1)
Lymphs: 27 %
MCH: 32.6 pg (ref 26.6–33.0)
MCHC: 32.9 g/dL (ref 31.5–35.7)
MCV: 99 fL — ABNORMAL HIGH (ref 79–97)
Monocytes Absolute: 0.6 x10E3/uL (ref 0.1–0.9)
Monocytes: 7 %
Neutrophils Absolute: 5.7 x10E3/uL (ref 1.4–7.0)
Neutrophils: 64 %
Platelets: 301 x10E3/uL (ref 150–450)
RBC: 4.51 x10E6/uL (ref 4.14–5.80)
RDW: 11.9 % (ref 11.6–15.4)
WBC: 8.8 x10E3/uL (ref 3.4–10.8)

## 2024-09-15 LAB — COMPREHENSIVE METABOLIC PANEL WITH GFR
ALT: 19 IU/L (ref 0–44)
AST: 18 IU/L (ref 0–40)
Albumin: 4.7 g/dL (ref 3.8–4.8)
Alkaline Phosphatase: 69 IU/L (ref 47–123)
BUN/Creatinine Ratio: 16 (ref 10–24)
BUN: 17 mg/dL (ref 8–27)
Bilirubin Total: 0.7 mg/dL (ref 0.0–1.2)
CO2: 21 mmol/L (ref 20–29)
Calcium: 9.8 mg/dL (ref 8.6–10.2)
Chloride: 103 mmol/L (ref 96–106)
Creatinine, Ser: 1.07 mg/dL (ref 0.76–1.27)
Globulin, Total: 2.7 g/dL (ref 1.5–4.5)
Glucose: 135 mg/dL — ABNORMAL HIGH (ref 70–99)
Potassium: 5.2 mmol/L (ref 3.5–5.2)
Sodium: 140 mmol/L (ref 134–144)
Total Protein: 7.4 g/dL (ref 6.0–8.5)
eGFR: 71 mL/min/1.73

## 2024-09-15 LAB — LIPID PANEL
Chol/HDL Ratio: 3.1 ratio (ref 0.0–5.0)
Cholesterol, Total: 125 mg/dL (ref 100–199)
HDL: 40 mg/dL
LDL Chol Calc (NIH): 67 mg/dL (ref 0–99)
Triglycerides: 93 mg/dL (ref 0–149)
VLDL Cholesterol Cal: 18 mg/dL (ref 5–40)

## 2024-09-15 LAB — MICROALBUMIN / CREATININE URINE RATIO
Creatinine, Urine: 156.7 mg/dL
Microalb/Creat Ratio: 10 mg/g{creat} (ref 0–29)
Microalbumin, Urine: 15.5 ug/mL

## 2024-09-15 LAB — HEMOGLOBIN A1C
Est. average glucose Bld gHb Est-mCnc: 126 mg/dL
Hgb A1c MFr Bld: 6 % — ABNORMAL HIGH (ref 4.8–5.6)

## 2024-09-18 ENCOUNTER — Ambulatory Visit: Payer: Self-pay | Admitting: Family Medicine

## 2024-09-18 NOTE — Therapy (Unsigned)
 " OUTPATIENT PHYSICAL THERAPY THORACOLUMBAR EVALUATION   Patient Name: Edward Russo MRN: [id number] DOB:[birth year], 79 y.o., male Today's Date: 09/19/2024  END OF SESSION:  PT End of Session - 09/19/24 1508     Visit Number 1    Number of Visits 17    Date for Recertification  12/12/24    Authorization Type [employer] reporting period from 09/19/2024    PT Start Time 1518    PT Stop Time 1604    PT Time Calculation (min) 46 min    Activity Tolerance Patient tolerated treatment well;No increased pain    Behavior During Therapy [facility] for tasks assessed/performed          Past Medical History:  Diagnosis Date   Angina pectoris 05/14/2020   Aortic ectasia, thoracic 06/12/2015   BPH with urinary obstruction 11/27/2019   COPD (chronic obstructive pulmonary disease) (HCC) 10/04/2017   Coronary artery disease involving native coronary artery of native heart 06/12/2015   Enlarged prostate 05/14/2020   Esophageal reflux 10/04/2017   Essential hypertension 06/12/2015   Hearing loss 05/14/2020   Hyperlipidemia LDL goal <100 06/12/2015   Hypertension    Hypertensive heart disease without congestive heart failure 05/14/2020   Myocardial infarction (HCC)    Polio    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   Type 2 diabetes mellitus (HCC) 10/04/2017   Past Surgical History:  Procedure Laterality Date   BACK SURGERY  2002   lower   CARDIAC CATHETERIZATION  2011   patent LTA and RTA at cath in 2011    CARDIOVERSION N/A 02/25/2024   Procedure: CARDIOVERSION;  Surgeon: Darliss Rogue, MD;  Location: [facility];  Service: Cardiovascular;  Laterality: N/A;   CORONARY ARTERY BYPASS GRAFT  1996   TRANSURETHRAL RESECTION OF PROSTATE N/A 11/27/2019   Procedure: TRANSURETHRAL RESECTION OF THE PROSTATE (TURP);  Surgeon: Ottelin, Mark, MD;  Location: [facility];  Service: Urology;  Laterality: N/A;   Patient Active Problem List   Diagnosis Date Noted   Left  inguinal hernia 09/14/2024   Chronic back pain 03/14/2024   Persistent atrial fibrillation (HCC) 02/25/2024   Angina pectoris 05/14/2020   Hearing loss 05/14/2020   Hypertensive heart disease without congestive heart failure 05/14/2020   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   BPH with urinary obstruction 11/27/2019   COPD (chronic obstructive pulmonary disease) (HCC) 10/04/2017   Esophageal reflux 10/04/2017   Type 2 diabetes mellitus (HCC) 10/04/2017   Aortic ectasia, thoracic 06/12/2015   Coronary artery disease involving native coronary artery of native heart 06/12/2015   Hypertension associated with diabetes (HCC) 06/12/2015   Hyperlipidemia associated with type 2 diabetes mellitus (HCC) 06/12/2015    PCP: Myrla Jon HERO, MD   REFERRING PROVIDER: Myrla Jon HERO, MD   REFERRING DIAG:  Postpolio syndrome [facility]) Chronic bilateral low back pain without sciatica  Rationale for Evaluation and Treatment: Rehabilitation  THERAPY DIAG:  Post-polio syndrome (HCC)  Chronic bilateral low back pain without sciatica  ONSET DATE: 2002  SUBJECTIVE:  SUBJECTIVE STATEMENT: Pt attended PT with his wife Lessie?). Pt presents with LBP that began after surgery for ruptured disk in 2002. Pain has affected his ADL's with forward bending and walking his dog being extremely difficult and put him in more pain. Feels more confident in standing back up, because of his change in mental aspect over the past couple months. Patient has post-polio syndrome that prevents him from doing certain things; mostly affecting his right arm with shoulder flexion motions and sustaining muscle mass.   PERTINENT HISTORY:  Patient is a 79 y.o. male who presents to outpatient physical therapy with a referral for medical diagnosis  Postpolio syndrome (HCC), Chronic bilateral low back pain without sciatica. This patient's chief complaints consist of low back pain, leading to the following functional deficits: forward bending, exercise, activity level. Relevant past medical history and comorbidities include the following: he has Aortic ectasia, thoracic; Coronary artery disease involving native coronary artery of native heart; Hypertension associated with diabetes (HCC); Hyperlipidemia associated with type 2 diabetes mellitus (HCC); COPD (chronic obstructive pulmonary disease) (HCC); Esophageal reflux; Type 2 diabetes mellitus (HCC); BPH with urinary obstruction; Angina pectoris; Hearing loss; Hypertensive heart disease without congestive heart failure; Postpolio syndrome (HCC); Persistent atrial fibrillation (HCC); Chronic back pain; and Left inguinal hernia on their problem list. he  has a past medical history of Angina pectoris (05/14/2020), Aortic ectasia, thoracic (06/12/2015), BPH with urinary obstruction (11/27/2019), COPD (chronic obstructive pulmonary disease) (HCC) (10/04/2017), Coronary artery disease involving native coronary artery of native heart (06/12/2015), Enlarged prostate (05/14/2020), Esophageal reflux (10/04/2017), Essential hypertension (06/12/2015), Hearing loss (05/14/2020), Hyperlipidemia LDL goal <100 (06/12/2015), Hypertension, Hypertensive heart disease without congestive heart failure (05/14/2020), Myocardial infarction (HCC), Polio, Postpolio syndrome (HCC) (05/14/2020), and Type 2 diabetes mellitus (HCC) (10/04/2017). he  has a past surgical history that includes Coronary artery bypass graft (1996); Cardiac catheterization (2011); Back surgery (2002); Transurethral resection of prostate (N/A, 11/27/2019); and Cardioversion (N/A, 02/25/2024).  PAIN:   Are you having pain? No, mild discomfort NPRS: Current: 1/10,  Best: 1/10, Worst: 5/10. Pain location: left side low back  Pain description: achey, centralized  pain N/T: None present   Aggravating factors: bending over Relieving factors: tylenol     PRECAUTIONS: None  RED FLAGS:  Patient denies hx of cancer, stroke, seizures, unexplained weight loss, unexplained changes in bowel or bladder problems, unexplained stumbling or dropping things, osteoporosis, and spinal surgery Pt presents with: COPD, Post polio syndrome  WEIGHT BEARING RESTRICTIONS: No  FALLS:  Has patient fallen in last 6 months? No   PATIENT GOALS:  Wants to have his back get better.    OBJECTIVE:  Note: Objective measures were completed at Evaluation unless otherwise noted.  DIAGNOSTIC FINDINGS:  No relevant imaging noted during evaluation.  PATIENT SURVEYS:   MODIFIED OSWESTRY DISABILITY SCALE  Date: 09/19/2024 Score  Pain intensity 1 = The pain is bad, but I can manage without having to take pain medication  2. Personal care (washing, dressing, etc.) 0 =  I can take care of myself normally without causing increased pain.  3. Lifting 4 = I can lift only very light weights  4. Walking 1 = Pain prevents me from walking more than 1 mile.  5. Sitting 1 =  I can only sit in my favorite chair as long as I like.  6. Standing 3 =  Pain prevents me from standing more than 1/2 hour.  7. Sleeping 1 = I can sleep well only by using pain medication.  8.  Social Life 1 =  My social life is normal, but it increases my level of pain.  9. Traveling 1 =  I can travel anywhere, but it increases my pain.  10. Employment/ Homemaking 3 = Pain prevents me from doing anything but light duties.  Total 16/50    Minimally Clinically Important Difference (MCID) = 12.8%   COGNITION: Overall cognitive status: Within functional limits for tasks assessed and has trouble hearing so often had to repeat explanations especially because of masks.     SENSATION: Not tested  POSTURE: No Significant postural limitations  PALPATION: Not TTP  LUMBAR ROM:   AROM eval  Flexion Limited    Extension   Right lateral flexion Limited and pain  Left lateral flexion Limited and pain  Right rotation   Left rotation    (Blank rows = not tested)    Flexion would have been painful if he went lower in motion     LOWER EXTREMITY ROM:     Active  Right eval Left eval  Hip flexion WNL WNL  Hip extension WNL WNL  Hip abduction    Hip adduction    Hip internal rotation    Hip external rotation WNL WNL  Knee flexion WNL WNL  Knee extension WNL WNL  Ankle dorsiflexion    Ankle plantarflexion    Ankle inversion    Ankle eversion     (Blank rows = not tested)   LOWER EXTREMITY MMT:    MMT Right eval Left eval  Hip flexion 4 4  Hip extension 4 4  Hip abduction 5 5  Hip adduction    Hip internal rotation    Hip external rotation    Knee flexion    Knee extension 5 5  Ankle dorsiflexion 5 5  Ankle plantarflexion    Ankle inversion    Ankle eversion     (Blank rows = not tested)  LUMBAR SPECIAL TESTS:  FABER test: Patient experienced concordant pain in back while doing left leg, right leg had non concordant pain in groin region.   FUNCTIONAL TESTS:  5 times sit to stand: Baseline will be performed at 2nd session.  GAIT: Distance walked: 70 Assistive device utilized: None Level of assistance: Complete Independence Comments: Patient had no abnormal gait when ambulating  TREATMENT DATE: 09/19/2024                                                                                                                               Neuromuscular Re-education: a technique or exercise performed with the goal of improving the level of communication between the body and the brain, such as for balance, motor control, muscle activation patterns, coordination, desensitization, quality of muscle contraction, proprioception, and/or kinesthetic sense needed for successful and safe completion of functional activities.    Supine PPT  2 x 10   Added to HEP  Pt required multimodal  cuing for proper technique and to facilitate  improved neuromuscular control, strength, range of motion, and functional ability resulting in improved performance and form.     PATIENT EDUCATION:  Education details: Patient received education on core exercise for muscle activation that will unload spine Person educated: Patient Education method: Explanation, Demonstration, and Handouts Education comprehension: verbalized understanding and returned demonstration  HOME EXERCISE PROGRAM: Access Code: [email or web address] URL: [email or web address] Date: 09/19/2024 Prepared by: Vernell Mariscal  Exercises - Supine Posterior Pelvic Tilt  - 1 x daily - 3 sets - 10 reps - 3 sec hold  ASSESSMENT:  CLINICAL IMPRESSION: Patient is a 79 y.o. male who was seen today for physical therapy evaluation and treatment for low back pain. Patient came in with wife and reported little to no pain during evaluation which allowed him to tolerate motions, strength, and treatment well. Patient expressed slight discomfort while lying in prone during MMT for hip extension. Patient expressed the presence of concordant pain in FABER position while testing the left leg. Patient demonstrated highly limited flexion of spine due to concordant pain occurring should he go any further. Patient additionally experienced ipsilateral concordant pain during left and right side bending. Patient would benefit from continued management of limiting condition by skilled physical therapist to address remaining impairments and functional limitations to work towards stated goals and return to PLOF or maximal functional independence.    OBJECTIVE IMPAIRMENTS: decreased activity tolerance, decreased ROM, decreased strength, and improper body mechanics.   ACTIVITY LIMITATIONS: bending  PARTICIPATION LIMITATIONS: cleaning, laundry, shopping, community activity, and yard work  PERSONAL FACTORS: Age, Fitness, Past/current experiences, and Time  since onset of injury/illness/exacerbation are also affecting patient's functional outcome.   REHAB POTENTIAL: Good  CLINICAL DECISION MAKING: Stable/uncomplicated  EVALUATION COMPLEXITY: Low   GOALS: Goals reviewed with patient? No  SHORT TERM GOALS: Target date: 10/03/2024  Patient will be independent with initial home exercise program for self-management of symptoms. Baseline: Initial HEP provided at IE (09/19/24); Goal status: INITIAL  LONG TERM GOALS: Target date: 12/12/2024  Patient will be independent with a long-term home exercise program for self-management of symptoms.  Baseline: Initial HEP provided at IE (09/19/24); Goal status: INITIAL  2.  Patient will demonstrate improved Modified Oswestry Disability Index (mODI) to equal or less than 10% to demonstrate improvement in overall condition and self-reported functional ability.  Baseline: 16/50 (09/19/24); Goal status: INITIAL  3.  Patient will demonstrate increased motion in forward bending to improve his ability to complete ADL. Baseline: limited by 50% (09/19/24); Goal status: INITIAL  4.  Patient will be able to perform 5TSTS at appropriate time for his age group to increase his overall function and ADL. Baseline: to be measured at visit 2 as appropriate (09/19/24); Goal status: INITIAL  5.  Patient will demonstrate improvement in Patient Specific Functional Scale (PSFS) of equal or greater than 8/10 points to reflect clinically significant improvement in patient's most valued functional activities. Baseline: to be measured at visit 2 as appropriate (09/19/24); Goal status: INITIAL   PLAN:  PT FREQUENCY: 2x/week  PT DURATION: 8 weeks  PLANNED INTERVENTIONS: 97164- PT Re-evaluation, 97750- Physical Performance Testing, 97110-Therapeutic exercises, 97530- Therapeutic activity, [id number]- Neuromuscular re-education, 97535- Self Care, [id number]- Manual therapy, and Patient/Family education.  PLAN FOR NEXT SESSION:  Perform 5TSTS and for baseline assessment to monitor progression of muscle growth and ADL function.Take patient BP. Review HEP. Consider checking form in bending forward and correcting as needed, consider adding sit to stands to HEP depending on pt tolerance. Consider working on core and  LE strengthening to improve function and strength.   Vernell Ahniyah Giancola SPT  Vernell Mariscal, Student-PT 09/19/2024, 5:07 PM  "

## 2024-09-19 ENCOUNTER — Ambulatory Visit: Attending: Family Medicine | Admitting: Physical Therapy

## 2024-09-19 ENCOUNTER — Encounter: Payer: Self-pay | Admitting: Physical Therapy

## 2024-09-19 DIAGNOSIS — M545 Low back pain, unspecified: Secondary | ICD-10-CM | POA: Insufficient documentation

## 2024-09-19 DIAGNOSIS — G8929 Other chronic pain: Secondary | ICD-10-CM | POA: Insufficient documentation

## 2024-09-19 DIAGNOSIS — M5459 Other low back pain: Secondary | ICD-10-CM | POA: Insufficient documentation

## 2024-09-19 DIAGNOSIS — G14 Postpolio syndrome: Secondary | ICD-10-CM | POA: Insufficient documentation

## 2024-09-19 DIAGNOSIS — R29818 Other symptoms and signs involving the nervous system: Secondary | ICD-10-CM | POA: Insufficient documentation

## 2024-09-19 NOTE — Therapy (Unsigned)
 " OUTPATIENT PHYSICAL THERAPY EVALUATION   Patient Name: Edward Russo MRN: [id number] DOB:1945-10-26, 79 y.o., male Today's Date: 09/19/2024  END OF SESSION:  PT End of Session - 09/19/24 1508     Visit Number 1    Number of Visits 17    Date for Recertification  12/12/24    Authorization Type [employer] reporting period from 09/19/2024    PT Start Time 1518    PT Stop Time 1604    PT Time Calculation (min) 46 min    Activity Tolerance Patient tolerated treatment well;No increased pain    Behavior During Therapy [facility] for tasks assessed/performed          Past Medical History:  Diagnosis Date   Angina pectoris 05/14/2020   Aortic ectasia, thoracic 06/12/2015   BPH with urinary obstruction 11/27/2019   COPD (chronic obstructive pulmonary disease) (HCC) 10/04/2017   Coronary artery disease involving native coronary artery of native heart 06/12/2015   Enlarged prostate 05/14/2020   Esophageal reflux 10/04/2017   Essential hypertension 06/12/2015   Hearing loss 05/14/2020   Hyperlipidemia LDL goal <100 06/12/2015   Hypertension    Hypertensive heart disease without congestive heart failure 05/14/2020   Myocardial infarction (HCC)    Polio    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   BPH with urinary obstruction 11/27/2019   COPD (chronic obstructive pulmonary disease) (HCC) 10/04/2017   Esophageal reflux 10/04/2017   Type 2 diabetes mellitus (HCC) 10/04/2017   Aortic ectasia, thoracic 06/12/2015   Coronary artery disease involving native coronary artery of native heart 06/12/2015   Hypertension associated with diabetes (HCC) 06/12/2015   Hyperlipidemia associated with type 2 diabetes mellitus (HCC) 06/12/2015    PCP: Myrla Jon HERO, MD  REFERRING PROVIDER: Myrla Jon HERO, MD  REFERRING DIAG: postpolio syndrome, chronic bilateral low back pain without sciatica  Rationale for Evaluation and Treatment: Rehabilitation  THERAPY DIAG:  Post-polio syndrome (HCC)  Chronic bilateral low back pain without sciatica  ONSET DATE: ***  SUBJECTIVE:  SUBJECTIVE STATEMENT: Patient is here for PT evaluation and treatment of chronic low back pain and postpolio syndrome.  Patient states he has pain in the small of his back. He had back surgery in 2002 for a ruptured disc (happened when he was using pavers). He is hard of hearing. 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 It mostly affects his right arm. He has a skinny arm but cannot lift his R arm up.   He has had his current pain 4-5 years. He was asking the doctor and finally was told he has arthritis about 3 years ago. He felt dismissed by his doctors regarding his pain in the past.   He states it is really an effort to get anything from the  floor (such as picking up dog poop). In the last couple of months he feels more confident in standing back up, but for a while he felt really afraid standing back up. His wife usually walks the dog and he picks up the dog poop. He states this pain is really life changing for him.   He got left knee pain over the weekend without knowing why.   What are your expectations for today? *** Manner of onset (traumatic, sudden, insidious): ***. When did it start? *** Related signs and symptoms: *** Previous episodes: *** Occupation: *** Recreational Activities and hobbies: *** Functional limitations: bending over, difficulty picking things up from the floor, reaching the floor,   PAIN: Are you having pain? Yes NPRS: Current: slight discomfort 0.5-1/10,  Best: ***/10, Worst: 5/10. Pain location: middle of low back  Pain description: achy (not sharp) it's there like a toothache . Numbness/tingling: denies Aggravating factors: bending over.  Relieving factors: walking at a good pace, tylenol  (really helps while it is in his system) 24 hour clock: *** Irritability of the pain or symptoms:  Time to onset (T1): ***,  Time to quit (T2): *** Time to feel better (T3): ***   PERTINENT HISTORY:  Patient is a 79 y.o. male who presents to outpatient physical therapy with a referral for medical diagnosis postpolio syndrome, chronic bilateral low back pain without sciatica. This patient's chief complaints consist of ***, leading to the following functional deficits: ***. Relevant past medical history and comorbidities include the following: he has Aortic ectasia, thoracic; Coronary artery disease involving native coronary artery of native heart; Hypertension associated with diabetes (HCC); Hyperlipidemia associated with type 2 diabetes mellitus (HCC); COPD (chronic obstructive pulmonary disease) (HCC); Esophageal reflux; Type 2 diabetes mellitus (HCC); BPH with urinary obstruction; Angina pectoris; Hearing  loss; Hypertensive heart disease without congestive heart failure; Postpolio syndrome (HCC); Persistent atrial fibrillation (HCC); Chronic back pain; and Left inguinal hernia on their problem list. he  has a past medical history of afib on anticoagulant, Angina pectoris (05/14/2020), Aortic ectasia, thoracic (06/12/2015), BPH with urinary obstruction (11/27/2019), COPD (chronic obstructive pulmonary disease) (HCC) (10/04/2017), Coronary artery disease involving native coronary artery of native heart (06/12/2015), Enlarged prostate (05/14/2020), Esophageal reflux (10/04/2017), Essential hypertension (06/12/2015), Hearing loss (05/14/2020), Hyperlipidemia LDL goal <100 (06/12/2015), Hypertension, Hypertensive heart disease without congestive heart failure (05/14/2020), Myocardial infarction (HCC), Polio, Postpolio syndrome (HCC) (05/14/2020), and Type 2 diabetes mellitus (HCC) (10/04/2017). he  has a past surgical history that includes Coronary artery bypass graft (1996); Cardiac catheterization (2011); Back surgery (Right L5-S1 semihemilaminectomy and diskectomy, microdissection with microscope by Lynwood Mill, MD in 2002 for herniated disk right L5-S1 with right S1 radiculopathy); Transurethral resection of prostate (N/A, 11/27/2019); and Cardioversion (  N/A, 02/25/2024). Patient denies hx of cancer, stroke, seizures, unexplained stumbling or dropping things, and osteoporosis. He has lost weight but he thinks it is due to not eating (doctor recommended ensure which increased his weight by 5 lbs). He expects he could get larger if he ate 3 meals a day.   Exercise history: ***  PAIN: Are you having pain? Yes NPRS: Current: ***/10,  Best: ***/10, Worst: ***/10. Pain location:  Pain description: *** Aggravating factors: *** Relieving factors: ***   PRECAUTIONS: None  WEIGHT BEARING RESTRICTIONS: No  FALLS:  Has patient fallen in last 6 months? No but once in a while comes sort of close  PATIENT  GOALS: I am hoping there are exercises or routines that I can do that can make the back better and improve some.   NEXT MD VISIT: ***  OBJECTIVE  DIAGNOSTIC FINDINGS:  No recent relevant imaging or testing upon chart review 09/19/24.  SELF-REPORTED FUNCTION MODIFIED OSWESTRY DISABILITY SCALE  Date: *** Score  Pain intensity {ODI 1:32962}  2. Personal care (washing, dressing, etc.) {ODI 2:32963}  3. Lifting {ODI 3:32964}  4. Walking {ODI 4:32965}  5. Sitting {ODI 5:32966}  6. Standing {ODI 6:32967}  7. Sleeping {ODI 7:32968}  8. Social Life {ODI 8:32969}  9. Traveling {ODI 9:32970}  10. Employment/ Homemaking {ODI 10:32971}  Total ***/50  Minimally Clinically Important Difference (MCID) = 12.8%   STANDING Observation Posture Seated:  Standing: Movement patterns and painful movements Sit <> stand:  Bed mobility:  Other:  AROM Lumbar AROM Flexion: fingers just below knees, concordant pain at base of spine Extension: <25% slight discomfort at base of spine Side Flexion: R: 25% a little right sided pain L: 25% a left sided pain Rotation: R: *** L: ***  Traction Alleviation Test *** Standing Lower Myotome Screen Single leg stance heel raise 1x10 with B UE hand held support:  R:  L:  Walking Regionalization (as needed) Flexion:  Sidebending:  R:  L:   SITTING Neurological Testing Neural Tension Testing Slump Flexion based  R: *** L: *** Extension based  R: L:  Reflexes if high load sensitivity Patellar Reflex (L3-L4):  R: *** L: ***  Hamstring Reflex (L5) R: *** L: ***  Achilles Reflex (S1) R: *** L: ***  SUPINE Neurological Testing Neural Tension Testing Straight Leg Raise:  From supine R: *** L: *** With pre-flexed hip/knee R: *** L: *** Bowstring test:  R: *** L: *** Reflex Testing Patellar Reflex (L3-L4):  R: *** L: ***  Place bolster under knees and grasp patellar tendon, induce reflex at patellar tendon  Alternative:  Support distal thighs with arm under knees, induce reflex at patellar tendon  Hamstring Reflex (L5) R: *** L: ***  Place bolster under knees and place hip in slight ER, grasp medial HS tendon and compress, then add stretch by dragging distally, strike fingers directly over tendon.  Alternative: Support distal thigh with one knee under the patient's ipsilateral knee, position hip in slight ER, grasp medial HS tendon and compress, then add stretch by dragging distally, strike fingers directly over tendon.  Achilles Reflex (S1) R: *** L: ***  Place bolster under knees  Hold by lateral (5th), MTP joint of foot, lift enough to free heel from table  ? Stand to see area of strike (next to foot, not behind)  ? Strike lateral edge of foot just below fingers for symmetrical strike  Alternative: Preposition into 90/90 hip/knee flexion  ? Rest leg on outside of  forearm  ? Hold by lateral (5th), MTP joint of foot  ? Stand to see area of strike (next to foot, not behind)  ? Strike lateral edge of foot just below fingers for symmetrical strike  Adductor R: *** L: ***   Knee bent, externally rotated and abducted  ? Tendon is fairly high on adductor  ? Push in with thumb and strike thumb with hammer  ? Can resist adduction to make tendon pop to find sweet spot  Myotome Testing Cleanest Lumbar Primary and Secondary Myotomes for testing  L3  Quad   L4  Anterior Tibialis  Test quadriceps   L5  EHL  Test hamstrings   S1  Ankle Eversion  Test hip abductors   S2  Gastrocnemius   Ankle Eversion (S1) R: *** L: ***   ? Stand on the same side of the patients testing LE ? Patient slides to the edge of the table ? Caudal arm cups the patients foot (fingers on plantar surface of foot, thumb on dorsal foot), shape hand to foot to increase contact area ? Cranial arm cradles along patients medial lower leg to stabilize with break testing ? Tuck caudal arms elbow into hip, square legs with shoulders, toes  pointing out ? Ask the patient to move outside of foot out toward you ? 123, Hold ? Use lunge stance (push off the back heel) to perform the break test in direction of inversion Anterior Tibialis (L4-L5) R: *** L: ***   ? Stand on the opposite side of the patients testing LE ? Patient slides to the edge of the table ? Cranial arm cradles along patients lateral lower leg to stabilize with break testing ? Caudal arm cups the patients foot (fingers on plantar surface of foot, thumb on dorsal foot), shape hand to foot to increase contact area ? Tuck the caudal arms elbow into side, square legs with shoulders, toes pointing out ? Ask the patient to move ankle up and inward ? 123, Hold ? Use lunge stance (push off the back heel) to perform break test in direction of PF and eversion Extensor Hallucis Longus (L5) R: *** L: ***   ? Can test both simultaneously ? Stand at the foot of the table ? Ask the patient to pull great toes up towards shin ? 123, hold ? Perform brake test in the direction of toe flexion by use thumbs to pull down into flexion with 1st finger curled and stabilizing on ball of foot ? If unclean, test unilaterally and use thenar to break one at a time. Additional Myotome Testing Quadriceps (L3) R: *** L: ***   ? If anterior tibialis is weak (L4), assess level above for involvement, if tolerated ? Especially important if the patient reports intermittent buckling of the LE and demonstrates weakness with anterior tibialis ? NOT a break test ? Safety test for falls ? Seated, popliteal space against table for fulcrum, patient leaning back onto hands ? 123, hold ? Stabilize at hip and press down on ankle by dropping body weight Hamstring (L5) R: *** L: ***   ? Can test if weakness noted with EHL ? Supine, stand on the same side as the testing LE ? Bring the LE (passively) into hip flexion, and max knee flexion ? Hold the patients knee and cup the patients  ankle ? Ask the patient to dont let you pull the foot up ? Pull into knee extension ? looking for increase in strength like biceps ?  NOTE: Caution with cramping ? Hip Abductors (L5, S1) R: *** L: ***   ? Can test if weakness noted with ankle eversion ? Side Lying with hip in neutral rotation, slight extension ? Resist hip abduction ? Stand in front to see patients face SIDELYING Palpation Muscle irritability and guarding QL, multifidi, erector spinae, glute medius (cranial and posterior to greater trochanter)  PRONE Prone Iliopsoas Length Assessment (manual)  R: *** (stiffness and end feel) L: *** ? Patient prone, both legs on table,  ? Pillow under belly, above ASIS  ? Stand on the opposite side of table, wide stance, leaning on table  ? Stabilize with ulnar border of cranial hand at ischial tuberosity  ? Caudal hand grasps under patient's distal thigh  ? Compress and drag ischium inferiorly ? lift thigh into extension  ? Assess for end-feel and compare to opposite side  To find the ischial tuberosity, just go below gluteal fold and press in medially (more medial than you think)  ? If you don't stabilize well at ischium, you'll get lumbar extension.  ? A normal muscular end feel should be elastic  ? Common to find a dysfunctional, quicker, firmer stop in psoas. This results in pulling on the spine.  Anterior capsule differentiation with sidebending as needed  Prone Iliopsoas Length (with table)  R: *** L: *** For big heavy legs  ? Patient prone, pillow above ASIS, gluteal fold at the crease of the table  ? Opposite LE off the side of the table with hip/knee flexed, foot under hip (use a wedge or your foot to stabilize the patient's foot on the ground)  ? If the hip rolls up off the table, then it's stiff.  ? If hip is down still, Lift the end of the table until hip comes up (watch the pelvis and spine for extension)  ? Use ulnar border of cranial hand reinforced by caudal  hand to direct anterior/inferior force at proximal femur  ? Assess for end-feel and compare to opposite side    NOTE: Less perceivable stretch sensation of RF than psoas is common  ? Psoas can be a source of pain.  ? Usually described at lower abdominal or groin pain when walking, running, or doing anything that uses psoas.  ? You can palpate psoas, no one will like it, but if it is the source of pain, it will be terrible.    DIAGNOSTIC FINDINGS:  ***  SELF-REPORTED FUNCTION {PROS:32229}  OBSERVATION/INSPECTION Posture Posture (seated): forward head, rounded shoulders, slumped in sitting.  Posture (standing): *** Posture correction: *** Anthropometrics Tremor: none Body composition: *** Muscle bulk: *** Skin: The incision sites appear to be healing well with no excessive redness, warmth, drainage or signs of infection present.  *** Edema: *** Functional Mobility Bed mobility: supine <> sit, mod I due to increased effort/time with slight low back pain coming from supine to sit. Prone <> supine mod I due to increased effort  and with pain  Transfers: *** Gait: grossly WFL for household and short community ambulation. More detailed gait analysis deferred to later date as needed. *** Stairs: ***  SPINE MOTION  LUMBAR SPINE AROM *Indicates pain Flexion: *** Extension: *** Side Flexion:   R ***  L *** Rotation:  R *** L *** Side glide:  R *** L ***    NEUROLOGICAL  Upper Motor Neuron Screen Babinski, Hoffman's and Clonus (ankle) negative bilaterally.  Dermatomes C2-T1 appears equal and intact to light touch except the  following: *** L2-S2 appears equal and intact to light touch except the following: *** Deep Tendon Reflexes R/L  ***+/***+ Biceps brachii reflex (C5, C6) ***+/***+ Brachioradialis reflex (C6) ***+/***+ Triceps brachii reflex (C7) ***+/***+ Quadriceps reflex (L4) ***+/***+ Achilles reflex (S1)  SPINE MOTION  CERVICAL SPINE AROM *Indicates  pain Flexion: *** Extension: *** Side Flexion:   R ***  L *** Rotation:  R *** L *** Protrusion: *** Retraction: ***   PERIPHERAL JOINT MOTION (in degrees)  ACTIVE RANGE OF MOTION (AROM) *Indicates pain Date Date Date  Joint/Motion R/L R/L R/L  Shoulder     Flexion / / /  Extension / / /  Abduction  / / /  External rotation / / /  Internal rotation / / /  Elbow     Flexion  / / /  Extension  / / /  Wrist     Flexion / / /  Extension  / / /  Radial deviation / / /  Ulnar deviation / / /  Pronation / / /  Supination / / /  Hip     Flexion / / /  Extension  / / /  Abduction / / /  Adduction / / /  External rotation / / /  Internal rotation  / / /  Knee     Extension / / /  Flexoin / / /  Ankle/Foot     Dorsiflexion (knee ext) / / /  Dorsiflexion (knee flex) / / /  Plantarflexion / / /  Everison / / /  Inversion / / /  Great toe extension / / /  Great toe flexion / / /  Comments:   PASSIVE RANGE OF MOTION (PROM) *Indicates pain Date Date Date  Joint/Motion R/L R/L R/L  Shoulder     Flexion / / /  Extension / / /  Abduction  / / /  External rotation / / /  Internal rotation / / /  Elbow     Flexion  / / /  Extension  / / /  Wrist     Flexion / / /  Extension  / / /  Radial deviation / / /  Ulnar deviation / / /  Pronation / / /  Supination / / /  Hip     Flexion  / / /  Extension  / / /  Abduction / / /  Adduction / / /  External rotation / / /  Internal rotation  / / /  Knee     Extension / / /  Flexion / / /  Ankle/Foot     Dorsiflexion (knee ext) / / /  Dorsiflexion (knee flex) / / /  Plantarflexion / / /  Everison / / /  Inversion / / /  Great toe extension / / /  Great toe flexion / / /  Comments:   MUSCLE PERFORMANCE (MMT):  *Indicates pain 09/19/24 Date Date  Joint/Motion R/L R/L R/L  Hip     Flexion (L1, L2) 4/4* / /  Extension (knee ext) 4/4* / /  Extension (knee flex) / / /  Abduction 5/5 / /  Adduction / / /   External rotation / / /  Internal rotation  / / /  Knee     Extension (L3) 5/5 / /  Flexion (S2) / / /  Ankle/Foot     Dorsiflexion (L4) 5/5 / /  Great toe extension (L5) / / /  Eversion (S1) / / /  Plantarflexion (S1) / / /  Inversion / / /  Pronation / / /  Great toe flexion / / /  Comments:  09/19/24: pain in left low back with L hip flexion and right hip extension  SPECIAL TESTS:  HIP SPECIAL TESTS FABER: R = non concordant pain at groin with overpressure, L = concordant pain at left low back.  ACCESSORY MOTION: ***  PALPATION: ***  SUSTAINED POSITIONS TESTING:  ***  REPEATED MOTIONS TESTING: ***  FUNCTIONAL/BALANCE TESTS: Five Time Sit to Stand (5TSTS): *** seconds Functional Gait Assessment (FGA): ***/30 (see details above) Ten meter walking trial ( ): *** m/s Six Minute Walk Test ( ): *** feet Timed Up and Go (TUG): *** seconds   Dynamic Gait Index: ***/24 BERG Balance Scale: ***/56 Tinetti/POMA: ***/28 Timed Up and GO: *** seconds (average of 3 trials) Trial 1: *** Trial 2: *** Trial 3: *** Romberg test: -Narrow stance, eyes open: *** seconds -Narrow stance, eyes closed: *** seconds Sharpened Romberg test: -Tandem stance, eyes open: *** seconds -Tandem stance, eyes closed: *** seconds  Narrow stance, firm surface, eyes open: *** seconds Narrow stance, firm surface, eyes closed: *** seconds Narrow stance, compliant surface, eyes open: *** seconds Narrow stance, compliant surface, eyes closed: *** seconds Single leg stance, firm surface, eyes open: R= *** seconds, L= *** seconds Single leg stance, compliant surface, eyes open: R= *** seconds, L= *** seconds Gait speed: *** m/s Functional reach test: *** inches      TREATMENT                                                                                                                            Neuromuscular Re-education: a technique or exercise performed with the goal of improving  the level of communication between the body and the brain, such as for balance, motor control, muscle activation patterns, coordination, desensitization, quality of muscle contraction, proprioception, and/or kinesthetic sense needed for successful and safe completion of functional activities.   Hooklying pelvic tilt AROM in pain limited/free range 1x10 after finding pain limited range and learning how to perform Tactile cuing under lumbar spine    PATIENT EDUCATION:  Education details: *** Person educated: {Person educated:25204} Education method: {Education Method:25205} Education comprehension: {Education Comprehension:25206}  HOME EXERCISE PROGRAM: ***  ASSESSMENT:  CLINICAL IMPRESSION: Patient is a 79 y.o. male referred to outpatient physical therapy with a medical diagnosis of postpolio syndrome, chronic bilateral low back pain without sciatica who presents with signs and symptoms consistent with ***. Patient presents with significant *** impairments that are limiting ability to complete *** without difficulty. Patient will benefit from skilled physical therapy intervention to address current body structure impairments and activity limitations to improve function and work towards goals set in current POC in order to return to prior level of function or maximal functional improvement.   Mechanical sensitivities: ***   OBJECTIVE IMPAIRMENTS: {opptimpairments:25111}.   ACTIVITY LIMITATIONS: {activitylimitations:27494}  PARTICIPATION  LIMITATIONS: {participationrestrictions:25113}  PERSONAL FACTORS: {Personal factors:25162} are also affecting patient's functional outcome.   REHAB POTENTIAL: {rehabpotential:25112}  CLINICAL DECISION MAKING: {clinical decision making:25114}  EVALUATION COMPLEXITY: {Evaluation complexity:25115}   GOALS: Goals reviewed with patient? {yes/no:20286}  SHORT TERM GOALS: Target date: 10/03/2024  Patient will be independent with initial home exercise  program for self-management of symptoms. Baseline: {HEPbaseline4:27310} (09/19/24); Goal status: INITIAL  LONG TERM GOALS: Target date: 12/12/2024  Patient will be independent with a long-term home exercise program for self-management of symptoms.  Baseline: {HEPbaseline4:27310} (09/19/24); Goal status: INITIAL  2.  Patient will demonstrate improved {SarasLTGPRO:32233} to demonstrate improvement in overall condition and self-reported functional ability.  Baseline: {Sarasgoalbaseline:32234} (09/19/24); Goal status: INITIAL  3.  *** Baseline: {Sarasgoalbaseline:32234} (09/19/24); Goal status: INITIAL  4.  *** Baseline: {Sarasgoalbaseline:32234} (09/19/24); Goal status: INITIAL  5.  Patient will demonstrate improvement in Patient Specific Functional Scale (PSFS) of equal or greater than 8/10 points to reflect clinically significant improvement in patient's most valued functional activities. Baseline: {Sarasgoalbaseline:32234} (09/19/24); Goal status: INITIAL  6.  Patient will report NPRS equal or less than 3/10 during functional activities during the last 2 weeks to improve their abilitly to complete community, work and/or recreational activities with less limitation. Baseline: ***/10 (09/19/24); Goal status: INITIAL   PLAN:  PT FREQUENCY: {rehab frequency:25116}  PT DURATION: {rehab duration:25117}  PLANNED INTERVENTIONS: {rehab planned interventions:25118::97110-Therapeutic exercises,97530- Therapeutic [phone]- Neuromuscular re-education,97535- Self Rjmz,02859- Manual therapy,Patient/Family education}.  PLAN FOR NEXT SESSION: ***   Camie SAUNDERS. Juli, PT, DPT, Cert. MDT, PRA-C 09/19/24, 6:54 PM  [facility] [address] [place], [place] [address] P: [phone] I F: [phone]   "

## 2024-09-20 NOTE — Therapy (Unsigned)
 " OUTPATIENT PHYSICAL THERAPY TREATMENT   Patient Name: Edward Russo MRN: [id number] DOB:Oct 17, 1945, 79 y.o., male Today's Date: 09/21/2024  END OF SESSION:  PT End of Session - 09/21/24 0852     Visit Number 2    Number of Visits 17    Date for Recertification  12/12/24    Authorization Type [employer] reporting period from 09/19/2024    PT Start Time 0908    PT Stop Time 0955    PT Time Calculation (min) 47 min    Activity Tolerance Patient tolerated treatment well;No increased pain    Behavior During Therapy [facility] for tasks assessed/performed           Past Medical History:  Diagnosis Date   Angina pectoris 05/14/2020   Aortic ectasia, thoracic 06/12/2015   BPH with urinary obstruction 11/27/2019   COPD (chronic obstructive pulmonary disease) (HCC) 10/04/2017   Coronary artery disease involving native coronary artery of native heart 06/12/2015   Enlarged prostate 05/14/2020   Esophageal reflux 10/04/2017   Essential hypertension 06/12/2015   Hearing loss 05/14/2020   Hyperlipidemia LDL goal <100 06/12/2015   Hypertension    Hypertensive heart disease without congestive heart failure 05/14/2020   Myocardial infarction (HCC)    Polio    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   Type 2 diabetes mellitus (HCC) 10/04/2017   Past Surgical History:  Procedure Laterality Date   BACK SURGERY  2002   lower   CARDIAC CATHETERIZATION  2011   patent LTA and RTA at cath in 2011    CARDIOVERSION N/A 02/25/2024   Procedure: CARDIOVERSION;  Surgeon: Darliss Rogue, MD;  Location: [facility] OR[facility];  Service: Cardiovascular;  Laterality: N/A;   CORONARY ARTERY BYPASS GRAFT  1996   TRANSURETHRAL RESECTION OF PROSTATE N/A 11/27/2019   Procedure: TRANSURETHRAL RESECTION OF THE PROSTATE (TURP);  Surgeon: Ottelin, Mark, MD;  Location: [facility];  Service: Urology;  Laterality: N/A;   Patient Active Problem List   Diagnosis Date Noted   Left inguinal hernia  09/14/2024   Chronic back pain 03/14/2024   Persistent atrial fibrillation (HCC) 02/25/2024   Angina pectoris 05/14/2020   Hearing loss 05/14/2020   Hypertensive heart disease without congestive heart failure 05/14/2020   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   BPH with urinary obstruction 11/27/2019   COPD (chronic obstructive pulmonary disease) (HCC) 10/04/2017   Esophageal reflux 10/04/2017   Type 2 diabetes mellitus (HCC) 10/04/2017   Aortic ectasia, thoracic 06/12/2015   Coronary artery disease involving native coronary artery of native heart 06/12/2015   Hypertension associated with diabetes (HCC) 06/12/2015   Hyperlipidemia associated with type 2 diabetes mellitus (HCC) 06/12/2015    PCP: Myrla Jon HERO, MD   REFERRING PROVIDER: Myrla Jon HERO, MD   REFERRING DIAG:  Postpolio syndrome [facility]) Chronic bilateral low back pain without sciatica  Rationale for Evaluation and Treatment: Rehabilitation  THERAPY DIAG:  Other low back pain  Other symptoms and signs involving the nervous system  ONSET DATE: 2002 original back injury, current episode 4-5 years prior to PT evaluation  SUBJECTIVE:  SUBJECTIVE STATEMENT: Pt states pain feels better since last visit, slight confusion in HEP.    PERTINENT HISTORY:  Patient is a 79 y.o. male who presents to outpatient physical therapy with a referral for medical diagnosis Postpolio syndrome (HCC), Chronic bilateral low back pain without sciatica. This patient's chief complaints consist of low back pain, leading to the following functional deficits: forward bending, exercise, activity level. Relevant past medical history and comorbidities include the following: he has Aortic ectasia, thoracic; Coronary artery disease involving native coronary artery  of native heart; Hypertension associated with diabetes (HCC); Hyperlipidemia associated with type 2 diabetes mellitus (HCC); COPD (chronic obstructive pulmonary disease) (HCC); Esophageal reflux; Type 2 diabetes mellitus (HCC); BPH with urinary obstruction; Angina pectoris; Hearing loss; Hypertensive heart disease without congestive heart failure; Postpolio syndrome (HCC); Persistent atrial fibrillation (HCC); Chronic back pain; and Left inguinal hernia on their problem list. he  has a past medical history of Angina pectoris (05/14/2020), Aortic ectasia, thoracic (06/12/2015), BPH with urinary obstruction (11/27/2019), COPD (chronic obstructive pulmonary disease) (HCC) (10/04/2017), Coronary artery disease involving native coronary artery of native heart (06/12/2015), Enlarged prostate (05/14/2020), Esophageal reflux (10/04/2017), Essential hypertension (06/12/2015), Hearing loss (05/14/2020), Hyperlipidemia LDL goal <100 (06/12/2015), Hypertension, Hypertensive heart disease without congestive heart failure (05/14/2020), Myocardial infarction (HCC), Polio, Postpolio syndrome (HCC) (05/14/2020), and Type 2 diabetes mellitus (HCC) (10/04/2017). he  has a past surgical history that includes Coronary artery bypass graft (1996); Cardiac catheterization (2011); Back surgery (2002); Transurethral resection of prostate (N/A, 11/27/2019); and Cardioversion (N/A, 02/25/2024).  PAIN:   Current pain NPRS = 0/10  From initial PT eval 09/19/24: Are you having pain? Yes, mild discomfort NPRS: Current: 1/10,  Best: 1/10, Worst: 5/10. Pain location: left side low back  Pain description: achey, centralized pain N/T: None present   Aggravating factors: bending over Relieving factors: tylenol     PRECAUTIONS: None  RED FLAGS:  Patient denies hx of cancer, stroke, seizures, unexplained weight loss, unexplained changes in bowel or bladder problems, unexplained stumbling or dropping things, osteoporosis, and spinal  surgery Pt presents with: COPD, Post polio syndrome, cardiac history  WEIGHT BEARING RESTRICTIONS: No  FALLS:  Has patient fallen in last 6 months? No   PATIENT GOALS:  Wants to have his back get better.    OBJECTIVE:   Today's Vitals   09/21/24 0913  BP: 115/74  Pulse: 61  SpO2: 100%    PALPATION Regions palpated: B lumbar paraspinals, QL, glute min/med, TFL, glute max, QF, posterior hip rotators, and around greater trochanters Most TTP with reproduction of pain at R posterior/superior glute med and at left QL. Noted to have extremely low muscle mass throughout low back and about the posterior and lateral pelvis and hips. Not TTP over either greater trochanter.   ACCESSORY MOTION Very hypomobile to CPA where assessed from lower thoracic spine to sacrum. Concordant, but non radiating pain at approx L3-L5.   FUNCTIONAL/BALANCE TESTS  6-Minute Walk Test ( ): Purpose: to assess function with community mobility Results: 1226 feet with no reproduction of concordant pain. Patient expressed slight discomfort in right hip but not concordant pain, discomfort subsided when sitting after finishing test. Analysis: patient ambulated less their long term goal of 1729  feet, which is the age matched norm for a healthy community dwelling adult male.  5 Times Sit To Stand Test (5TSTS): Purpose: to assess dynamic balance, sit <> stand transfer, and functional LE strength/power for functional mobility.  Results: 14.24 seconds with no UE support    Patient  expressed no feelings of discomfort  Analysis: patient completed test in greater than their long term goal of equal or less than 12.6 seconds seconds, which is the age matched norm for a healthy community dwelling adult male.   TREATMENT  Physical Performance Test or Measurement: a physical performance test or measurement with written report. and 5TSTS (see above)  Manual therapy: to reduce pain and tissue tension, improve  range of motion, neuromodulation, in order to promote improved ability to complete functional activities. PRONE  CPA mobilization grade 4 of L1 - L5 and S1 to assess joint stiffness, improve motion, and assess if relief in sx occurs with oscillations in grade 4 mobilization.   Patient received STM to relax tight back muscles (B lumbar paraspinals, QL, glute med, TFL, glute max, QF, and posterior hip rotators to assess tension/pain response and to decrease muscle guarding, stiffness and improve motion.   Therapeutic exercise: therapeutic exercises that incorporate ONE parameter at one or more areas of the body to centralize symptoms, develop strength and endurance, range of motion, and flexibility required for successful completion of functional activities.  Vitals were assessed today and results are listed above (for system's review)  Prone press ups to assess current ROM and flexibility but was stopped after one attempt due to limitation in strength in Right arm from Post-Polio syndrome (spine also very stiff)  Both exercises below were done with the intention of improving range of motion and increasing flexibility   Side-lying modified Open Book Thoracic Lumbar Rotation and Extension (both knees drawn up towards chest and top hand on chest)    1 x 10 each side  Patient had trouble understanding all motions involved with this exercise so switched to more simple movement   Lower Trunk Rotations   1 x 20 on each side  Added to HEP  Pt required multimodal cuing for proper technique and to facilitate improved neuromuscular control, strength, range of motion, and functional ability resulting in improved performance and form.   PATIENT EDUCATION:  Education details: Exercise purpose/form. Self management techniques.HEP including handout. Person educated: Patient and wife Arland Education method: Explanation, Demonstration, and Handouts Education comprehension: verbalized understanding and  returned demonstration  HOME EXERCISE PROGRAM: Access Code: [email or web address] URL: [email or web address] Date: 09/21/2024 Prepared by: Vernell Mariscal  Exercises - Supine Posterior Pelvic Tilt  - 1 x daily - 3 sets - 10 reps - 3 sec hold - Lower Trunk Rotations  - 1-2 x daily - 5-7 x weekly - 3 sets - 20 reps  ASSESSMENT:  CLINICAL IMPRESSION: Today's session focused on finding an appropriate exercise that was easiest for patient to comprehend since he has no prior history of exercise. The exercises completed focused on decreasing pain and improving overall motion and flexibility. During manual therapy patient presented with a large amount of stiffness in low back and was educated on muscle guarding and why STM and CPA mobilization would be beneficial to relax muscles and improve motion. He also was noted to have extremely low muscle mass where palpated. He demonstrates low confidence in exercising, and will likely need extra encouragement, education, and modifications to address this for success. Patient performed baseline assessment for and 5TSTS with no increase in concordant sx but were below age matched norms for healthy adult males; his blood pressure, heart rate, and SpO2 were WNL. Patient tolerated exercises well and with no increase in sx by end of session. Both patient and wife received education on new HEP and stated feeling  more confident in completing updated version of HEP. Patient would benefit from continued management of limiting condition by skilled physical therapist to address remaining impairments and functional limitations to work towards stated goals and return to PLOF or maximal functional independence.    OBJECTIVE IMPAIRMENTS: decreased activity tolerance, decreased mobility, difficulty walking, decreased ROM, decreased strength, impaired flexibility, improper body mechanics, pain, and poor motor control.   ACTIVITY LIMITATIONS: lifting, bending, caring for  others, and picking things up from the ground, looking after his dog, walking long distances with is wife  PARTICIPATION LIMITATIONS: cleaning, laundry, interpersonal relationship, shopping, community activity, yard work, and nurse, mental health walking  PERSONAL FACTORS: Age, Fitness, Past/current experiences, Time since onset of injury/illness/exacerbation, and 3+ comorbidities:  COPD, cardiac history, prior lumbar surgery, hx of afib, left inguinal hernia, hearing loss,  are also affecting patient's functional outcome.   REHAB POTENTIAL: Good  CLINICAL DECISION MAKING: Stable/uncomplicated  EVALUATION COMPLEXITY: Low   GOALS: Goals reviewed with patient? No  SHORT TERM GOALS: Target date: 10/03/2024  Patient will be independent with initial home exercise program for self-management of symptoms. Baseline: Initial HEP provided at IE (09/19/24); Goal status: In progress  LONG TERM GOALS: Target date: 12/12/2024  Patient will be independent with a long-term home exercise program for self-management of symptoms.  Baseline: Initial HEP provided at IE (09/19/24); Goal status: In progress  2.  Patient will demonstrate improved Modified Oswestry Disability Index (mODI) to equal or less than 10% to demonstrate improvement in overall condition and self-reported functional ability.  Baseline: 32% (09/19/24); Goal status: In progress  3.  Patient will demonstrate increased motion in forward bending to equal or greater than his usual baseline to improve his ability to complete ADL and retreive things from the grounds Baseline: fingers just below knees (09/19/24); Goal status: In progress  4.  Patient will be able to perform 5TSTS in equal or less than 12.6 seconds, which is the age matched norm for a health community dwelling adult, and with show good LE strength/power for functional activities. Baseline: to be measured at visit 2 as appropriate (09/19/24); 14.24 seconds (09/21/2024);  Goal status: In n  progress  5.  Patient will demonstrate improvement in Patient Specific Functional Scale (PSFS) of equal or greater than 8/10 points to reflect clinically significant improvement in patient's most valued functional activities. Baseline: to be measured at visit 2 as appropriate (09/19/24); Goal status: In progress  6. Patient will demonstrate the ability to ambulate equal or greater than 1729 which is his age matched norm for a health community dwelling adult male.  Baseline: to be measured at visit 2 as appropriate (09/19/24); 1226 ft (09/21/2024); Goal status: In progress   PLAN:  PT FREQUENCY: 2x/week  PT DURATION: 8-12 weeks  PLANNED INTERVENTIONS: 97164- PT Re-evaluation, 97750- Physical Performance Testing, 97110-Therapeutic exercises, 97530- Therapeutic activity, [id number]- Neuromuscular re-education, 97535- Self Care, 02859- Manual therapy, [id number]- Electrical stimulation (unattended), 20560 (1-2 muscles), 20561 (3+ muscles)- Dry Needling, Patient/Family education, Cryotherapy, and Moist heat.  PLAN FOR NEXT SESSION: Consider continuing with joint mobs, Review HEP, perform PSFS, Consider checking forward flexion to help adjust form as needed, Continue working on exercises to improve mobility and flexibility, motor control, and physical conditioning.   Vernell Mariscal SPT  Student physical therapist under direct supervision of licensed physical therapists during the entirety of the session.   Camie SAUNDERS. Juli, PT, DPT, Cert. MDT, PRA-C 09/21/24, 5:35 PM  [facility] [address] [place],  [place] [address] P: [phone] I F: [phone]  "

## 2024-09-21 ENCOUNTER — Ambulatory Visit: Admitting: Physical Therapy

## 2024-09-21 ENCOUNTER — Encounter: Payer: Self-pay | Admitting: Physical Therapy

## 2024-09-21 ENCOUNTER — Other Ambulatory Visit: Payer: Self-pay | Admitting: Cardiology

## 2024-09-21 VITALS — BP 115/74 | HR 61

## 2024-09-21 DIAGNOSIS — M5459 Other low back pain: Secondary | ICD-10-CM

## 2024-09-21 DIAGNOSIS — R29818 Other symptoms and signs involving the nervous system: Secondary | ICD-10-CM

## 2024-09-21 NOTE — Progress Notes (Signed)
 "     Electrophysiology Clinic Note    Date:  09/22/2024  Patient ID:  Edward Russo, Edward Russo 1946/02/05, MRN [id number] PCP:  Myrla Jon HERO, MD  Cardiologist:  Redell Cave, MD  Electrophysiologist:  Fonda Kitty, MD  Electrophysiology APP:  Cheryl Chay, NP    Discussed the use of AI scribe software for clinical note transcription with the patient, who gave verbal consent to proceed.   Patient Profile    Chief Complaint: AF follow-up  History of Present Illness: Edward Russo is a 79 y.o. male with PMH notable for persis Afib, CAD s/p CABG, HTN, HLD, COPD; seen today for Fonda Kitty, MD for routine electrophysiology followup.   He is s/p DCCV for AFib 01/2024. He last saw Dr. Kitty 02/2024 where he had not notice a difference in his symptoms while maintaining sinus rhythm. Discussed AAD (Dronedarone) vs ablation, patient wanted to think about options.   On follow-up today, he feels well without acute complaints. He is not aware of any AF episodes since last being seen. He continues to take xarelto  for stroke ppx, no significant bleeding concerns, does note that he gets mild knicks when shaving.   He rarely checks BP at home.  He denies chest pain, chest pressure, palpitations, increased SOB.    Arrhythmia/Device History No specialty comments available.    ROS:  Please see the history of present illness. All other systems are reviewed and otherwise negative.    Physical Exam    VS:  BP 122/62 (BP Location: Left Arm, Patient Position: Sitting, Cuff Size: Normal)   Pulse (!) 58   Ht 5' 10 (1.778 m)   Wt 150 lb 9.6 oz (68.3 kg)   SpO2 98%   BMI 21.61 kg/m  BMI: Body mass index is 21.61 kg/m.           Wt Readings from Last 3 Encounters:  09/22/24 150 lb 9.6 oz (68.3 kg)  09/14/24 145 lb (65.8 kg)  04/19/24 147 lb 6.4 oz (66.9 kg)     GEN- The patient is well appearing, alert and oriented x 3 today.   Lungs- Clear to ausculation bilaterally, normal  work of breathing.  Heart- Regular rate and rhythm, no murmurs, rubs or gallops Extremities- No peripheral edema, warm, dry   Studies Reviewed   Previous EP, cardiology notes.    EKG is ordered. Personal review of EKG from today shows:    EKG Interpretation Date/Time:  Friday September 22 2024 09:10:37 EST Ventricular Rate:  58 PR Interval:  132 QRS Duration:  78 QT Interval:  402 QTC Calculation: 394 R Axis:   35  Text Interpretation: Sinus bradycardia with Sinus arrhythmia Confirmed by Riccardo Holeman [id number]) on 09/22/2024 9:18:20 AM    TTE, 06/10/2021  1. Left ventricular ejection fraction, by estimation, is 60 to 65%. Left ventricular ejection fraction by 2D MOD biplane is 60.6 %. The left ventricle has normal function. The left ventricle has no regional wall motion abnormalities. Left ventricular diastolic parameters are indeterminate.   2. Right ventricular systolic function is normal. The right ventricular size is not well visualized.   3. Left atrial size was mildly dilated.   4. The mitral valve is normal in structure. Mild mitral valve regurgitation.   5. The aortic valve was not well visualized. Aortic valve regurgitation is not visualized.   Myocardial spect, 05/05/2019 The left ventricular ejection fraction is normal (55-65%). Nuclear stress EF: 59%. No wall motion abnormalities There was no ST  segment deviation noted during stress. Defect 1: There is a medium defect of moderate severity present in the basal inferolateral and mid inferolateral location. Findings consistent with prior myocardial infarction with peri-infarct ischemia in the base to mid inferolateral wall segment. This is an intermediate risk study. Prior CABG   Assessment and Plan     #) persis Afib S/p DCCV 01/2024, maintaining sinus rhythm We discussed escalating AF treatment with AAD or AF ablation, patient prefers to continue to monitor AF and if has recurrence will consider treatment.  Continue  3.125mg  coreg  BID  #) Hypercoag d/t persis afib CHA2DS2-VASc Score = at least 4 [CHF History: 0, HTN History: 1, Diabetes History: 0, Stroke History: 0, Vascular Disease History: 1, Age Score: 2, Gender Score: 0].  Therefore, the patient's annual risk of stroke is 4.8 %.    Stroke ppx - 20mg  xarelto  daily, appropriately dosed No bleeding concerns        Current medicines are reviewed at length with the patient today.   The patient does not have concerns regarding his medicines.  The following changes were made today:  none  Labs/ tests ordered today include:  Orders Placed This Encounter  Procedures   EKG 12-Lead     Disposition: Follow up with Dr. Kennyth or EP APP PRN   Continue follow-up with general cardiology as previously recommended, Aug 2026  Signed, Chantal Needle, NP  09/22/24  12:32 PM  Electrophysiology [facility] "

## 2024-09-22 ENCOUNTER — Ambulatory Visit: Attending: Cardiology | Admitting: Cardiology

## 2024-09-22 ENCOUNTER — Encounter: Payer: Self-pay | Admitting: Cardiology

## 2024-09-22 VITALS — BP 122/62 | HR 58 | Ht 70.0 in | Wt 150.6 lb

## 2024-09-22 DIAGNOSIS — I4819 Other persistent atrial fibrillation: Secondary | ICD-10-CM

## 2024-09-22 DIAGNOSIS — D6869 Other thrombophilia: Secondary | ICD-10-CM

## 2024-09-22 NOTE — Patient Instructions (Signed)
 Medication Instructions:  Your physician recommends that you continue on your current medications as directed. Please refer to the Current Medication list given to you today.  *If you need a refill on your cardiac medications before your next appointment, please call your pharmacy*  Lab Work: No labs ordered today    Testing/Procedures: No test ordered today   Follow-Up: At [facility], you and your health needs are our priority.  As part of our continuing mission to provide you with exceptional heart care, our providers are all part of one team.  This team includes your primary Cardiologist (physician) and Advanced Practice Providers or APPs (Physician Assistants and Nurse Practitioners) who all work together to provide you with the care you need, when you need it.  Your next appointment:   7 month(s)  Provider:   Redell Cave, MD     Follow up with Electrophysiology as needed for Afib.

## 2024-09-25 ENCOUNTER — Ambulatory Visit: Admitting: Physical Therapy

## 2024-09-27 ENCOUNTER — Ambulatory Visit: Admitting: Physical Therapy

## 2024-09-27 NOTE — Therapy (Unsigned)
 " OUTPATIENT PHYSICAL THERAPY TREATMENT   Patient Name: Edward Russo MRN: [id number] DOB:1946-05-13, 79 y.o., male Today's Date: 09/27/2024  END OF SESSION:     Past Medical History:  Diagnosis Date   Angina pectoris 05/14/2020   Aortic ectasia, thoracic 06/12/2015   BPH with urinary obstruction 11/27/2019   COPD (chronic obstructive pulmonary disease) (HCC) 10/04/2017   Coronary artery disease involving native coronary artery of native heart 06/12/2015   Enlarged prostate 05/14/2020   Esophageal reflux 10/04/2017   Essential hypertension 06/12/2015   Hearing loss 05/14/2020   Hyperlipidemia LDL goal <100 06/12/2015   Hypertension    Hypertensive heart disease without congestive heart failure 05/14/2020   Myocardial infarction (HCC)    Polio    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   Type 2 diabetes mellitus (HCC) 10/04/2017   Past Surgical History:  Procedure Laterality Date   BACK SURGERY  2002   lower   CARDIAC CATHETERIZATION  2011   patent LTA and RTA at cath in 2011    CARDIOVERSION N/A 02/25/2024   Procedure: CARDIOVERSION;  Surgeon: Darliss Rogue, MD;  Location: [facility];  Service: Cardiovascular;  Laterality: N/A;   CORONARY ARTERY BYPASS GRAFT  1996   TRANSURETHRAL RESECTION OF PROSTATE N/A 11/27/2019   Procedure: TRANSURETHRAL RESECTION OF THE PROSTATE (TURP);  Surgeon: Ottelin, Mark, MD;  Location: [facility];  Service: Urology;  Laterality: N/A;   Patient Active Problem List   Diagnosis Date Noted   Left inguinal hernia 09/14/2024   Chronic back pain 03/14/2024   Persistent atrial fibrillation (HCC) 02/25/2024   Angina pectoris 05/14/2020   Hearing loss 05/14/2020   Hypertensive heart disease without congestive heart failure 05/14/2020   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   BPH with urinary obstruction 11/27/2019   COPD (chronic obstructive pulmonary disease) (HCC) 10/04/2017   Esophageal reflux 10/04/2017   Type 2 diabetes  mellitus (HCC) 10/04/2017   Aortic ectasia, thoracic 06/12/2015   Coronary artery disease involving native coronary artery of native heart 06/12/2015   Hypertension associated with diabetes (HCC) 06/12/2015   Hyperlipidemia associated with type 2 diabetes mellitus (HCC) 06/12/2015    PCP: Myrla Jon HERO, MD   REFERRING PROVIDER: Myrla Jon HERO, MD   REFERRING DIAG:  Postpolio syndrome [facility]) Chronic bilateral low back pain without sciatica  Rationale for Evaluation and Treatment: Rehabilitation  THERAPY DIAG:  No diagnosis found.  ONSET DATE: 2002 original back injury, current episode 4-5 years prior to PT evaluation  SUBJECTIVE:  SUBJECTIVE STATEMENT: Pt states pain feels better since last visit, slight confusion in HEP.    PERTINENT HISTORY:  Patient is a 79 y.o. male who presents to outpatient physical therapy with a referral for medical diagnosis Postpolio syndrome (HCC), Chronic bilateral low back pain without sciatica. This patient's chief complaints consist of low back pain, leading to the following functional deficits: forward bending, exercise, activity level. Relevant past medical history and comorbidities include the following: he has Aortic ectasia, thoracic; Coronary artery disease involving native coronary artery of native heart; Hypertension associated with diabetes (HCC); Hyperlipidemia associated with type 2 diabetes mellitus (HCC); COPD (chronic obstructive pulmonary disease) (HCC); Esophageal reflux; Type 2 diabetes mellitus (HCC); BPH with urinary obstruction; Angina pectoris; Hearing loss; Hypertensive heart disease without congestive heart failure; Postpolio syndrome (HCC); Persistent atrial fibrillation (HCC); Chronic back pain; and Left inguinal hernia on their problem list.  he  has a past medical history of Angina pectoris (05/14/2020), Aortic ectasia, thoracic (06/12/2015), BPH with urinary obstruction (11/27/2019), COPD (chronic obstructive pulmonary disease) (HCC) (10/04/2017), Coronary artery disease involving native coronary artery of native heart (06/12/2015), Enlarged prostate (05/14/2020), Esophageal reflux (10/04/2017), Essential hypertension (06/12/2015), Hearing loss (05/14/2020), Hyperlipidemia LDL goal <100 (06/12/2015), Hypertension, Hypertensive heart disease without congestive heart failure (05/14/2020), Myocardial infarction (HCC), Polio, Postpolio syndrome (HCC) (05/14/2020), and Type 2 diabetes mellitus (HCC) (10/04/2017). he  has a past surgical history that includes Coronary artery bypass graft (1996); Cardiac catheterization (2011); Back surgery (2002); Transurethral resection of prostate (N/A, 11/27/2019); and Cardioversion (N/A, 02/25/2024).  PAIN:   Current pain NPRS = 0/10  From initial PT eval 09/19/24: Are you having pain? Yes, mild discomfort NPRS: Current: 1/10,  Best: 1/10, Worst: 5/10. Pain location: left side low back  Pain description: achey, centralized pain N/T: None present   Aggravating factors: bending over Relieving factors: tylenol     PRECAUTIONS: None  RED FLAGS:  Patient denies hx of cancer, stroke, seizures, unexplained weight loss, unexplained changes in bowel or bladder problems, unexplained stumbling or dropping things, osteoporosis, and spinal surgery Pt presents with: COPD, Post polio syndrome, cardiac history  WEIGHT BEARING RESTRICTIONS: No  FALLS:  Has patient fallen in last 6 months? No   PATIENT GOALS:  Wants to have his back get better.    OBJECTIVE:    TREATMENT  Therapeutic exercise: therapeutic exercises that incorporate ONE parameter at one or more areas of the body to centralize symptoms, develop strength and endurance, range of motion, and flexibility required for successful completion of  functional activities.  Lower Trunk Rotations to improve ROM and increasing flexibility  1 x 20 on each side  Added to HEP  Seated leg lifts over small towel roll in long sitting on plinth to improve hip flexor and quadriceps strength to support functional mobility     Knee to chest supine on plinth to improve lumbar flexion mobility and decrease low back stiffness         Lower Trunk Rotations to improve ROM and increasing flexibility  1 x 20 on each side  Added to HEP  Pt required multimodal cuing for proper technique and to facilitate improved neuromuscular control, strength, range of motion, and functional ability resulting in improved performance and form.   PATIENT EDUCATION:  Education details: Exercise purpose/form. Self management techniques.HEP including handout. Person educated: Patient and wife Arland Education method: Explanation, Demonstration, and Handouts Education comprehension: verbalized understanding and returned demonstration  HOME EXERCISE PROGRAM: Access Code: [email or web address] URL: [email or web address] Date: 09/21/2024 Prepared by: Vernell Mariscal  Exercises - Supine Posterior Pelvic Tilt  - 1 x daily - 3 sets - 10 reps - 3 sec hold - Lower Trunk Rotations  - 1-2 x daily - 5-7 x weekly - 3 sets - 20 reps  ASSESSMENT:  CLINICAL IMPRESSION: Today's session focused on   OBJECTIVE IMPAIRMENTS: decreased activity tolerance, decreased mobility, difficulty walking, decreased ROM, decreased strength, impaired flexibility, improper body mechanics, pain, and poor motor control.   ACTIVITY LIMITATIONS: lifting, bending, caring for others, and picking things up from the ground, looking after his dog, walking long distances with is wife  PARTICIPATION LIMITATIONS: cleaning, laundry, interpersonal relationship, shopping, community activity, yard work, and nurse, mental health walking  PERSONAL FACTORS: Age, Fitness, Past/current experiences, Time since onset of  injury/illness/exacerbation, and 3+ comorbidities:  COPD, cardiac history, prior lumbar surgery, hx of afib, left inguinal hernia, hearing loss,  are also affecting patient's functional outcome.   REHAB POTENTIAL: Good  CLINICAL DECISION MAKING: Stable/uncomplicated  EVALUATION COMPLEXITY: Low   GOALS: Goals reviewed with patient? No  SHORT TERM GOALS: Target date: 10/03/2024  Patient will be independent with initial home exercise program for self-management of symptoms. Baseline: Initial HEP provided at IE (09/19/24); Goal status: In progress  LONG TERM GOALS: Target date: 12/12/2024  Patient will be independent with a long-term home exercise program for self-management of symptoms.  Baseline: Initial HEP provided at IE (09/19/24); Goal status: In progress  2.  Patient will demonstrate improved Modified Oswestry Disability Index (mODI) to equal or less than 10% to demonstrate improvement in overall condition and self-reported functional ability.  Baseline: 32% (09/19/24); Goal status: In progress  3.  Patient will demonstrate increased motion in forward bending to equal or greater than his usual baseline to improve his ability to complete ADL and retreive things from the grounds Baseline: fingers just below knees (09/19/24); Goal status: In progress  4.  Patient will be able to perform 5TSTS in equal or less than 12.6 seconds, which is the age matched norm for a health community dwelling adult, and with show good LE strength/power for functional activities. Baseline: to be measured at visit 2 as appropriate (09/19/24); 14.24 seconds (09/21/2024);  Goal status: In n progress  5.  Patient will demonstrate improvement in Patient Specific Functional Scale (PSFS) of equal or greater than 8/10 points to reflect clinically significant improvement in patient's most valued functional activities. Baseline: to be measured at visit 2 as appropriate (09/19/24); Goal status: In progress  6.  Patient will demonstrate the ability to ambulate equal or greater than 1729 which is his age matched norm for a health community dwelling adult male.  Baseline: to be measured at visit 2 as appropriate (09/19/24); 1226 ft (09/21/2024); Goal status: In progress   PLAN:  PT FREQUENCY: 2x/week  PT DURATION: 8-12 weeks  PLANNED INTERVENTIONS: 97164- PT Re-evaluation, 97750- Physical Performance Testing, 97110-Therapeutic exercises, 97530- Therapeutic activity, [id number]- Neuromuscular re-education, 97535- Self Care, 02859- Manual therapy, G0283- Electrical stimulation (unattended), 20560 (1-2 muscles), 20561 (3+ muscles)- Dry Needling, Patient/Family education, Cryotherapy, and Moist heat.  PLAN FOR NEXT SESSION: Consider continuing with joint mobs, Review HEP, perform PSFS, Consider checking forward flexion to help adjust form as needed, Continue working on exercises to improve mobility and flexibility, motor control, and physical conditioning.   Vernell Mariscal SPT  Student physical therapist under direct supervision of licensed physical therapists during the entirety of the session.   Camie SAUNDERS. Juli, PT, DPT, Cert. MDT, PRA-C 09/27/24, 12:45 PM  [facility] [address] [place], [place] [address] P: [phone] I F: [phone]  "

## 2024-10-03 ENCOUNTER — Encounter: Payer: Self-pay | Admitting: Physical Therapy

## 2024-10-03 ENCOUNTER — Ambulatory Visit

## 2024-10-03 DIAGNOSIS — R29818 Other symptoms and signs involving the nervous system: Secondary | ICD-10-CM

## 2024-10-03 DIAGNOSIS — M5459 Other low back pain: Secondary | ICD-10-CM

## 2024-10-03 NOTE — Therapy (Cosign Needed)
 " OUTPATIENT PHYSICAL THERAPY TREATMENT   Patient Name: Edward Russo MRN: [id number] DOB:February 21, 1946, 79 y.o., male Today's Date: 10/03/2024  END OF SESSION:     Past Medical History:  Diagnosis Date   Angina pectoris 05/14/2020   Aortic ectasia, thoracic 06/12/2015   BPH with urinary obstruction 11/27/2019   COPD (chronic obstructive pulmonary disease) (HCC) 10/04/2017   Coronary artery disease involving native coronary artery of native heart 06/12/2015   Enlarged prostate 05/14/2020   Esophageal reflux 10/04/2017   Essential hypertension 06/12/2015   Hearing loss 05/14/2020   Hyperlipidemia LDL goal <100 06/12/2015   Hypertension    Hypertensive heart disease without congestive heart failure 05/14/2020   Myocardial infarction (HCC)    Polio    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   Type 2 diabetes mellitus (HCC) 10/04/2017   Past Surgical History:  Procedure Laterality Date   BACK SURGERY  2002   lower   CARDIAC CATHETERIZATION  2011   patent LTA and RTA at cath in 2011    CARDIOVERSION N/A 02/25/2024   Procedure: CARDIOVERSION;  Surgeon: Darliss Rogue, MD;  Location: [facility];  Service: Cardiovascular;  Laterality: N/A;   CORONARY ARTERY BYPASS GRAFT  1996   TRANSURETHRAL RESECTION OF PROSTATE N/A 11/27/2019   Procedure: TRANSURETHRAL RESECTION OF THE PROSTATE (TURP);  Surgeon: Ottelin, Mark, MD;  Location: [facility];  Service: Urology;  Laterality: N/A;   Patient Active Problem List   Diagnosis Date Noted   Left inguinal hernia 09/14/2024   Chronic back pain 03/14/2024   Persistent atrial fibrillation (HCC) 02/25/2024   Angina pectoris 05/14/2020   Hearing loss 05/14/2020   Hypertensive heart disease without congestive heart failure 05/14/2020   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   BPH with urinary obstruction 11/27/2019   COPD (chronic obstructive pulmonary disease) (HCC) 10/04/2017   Esophageal reflux 10/04/2017   Type 2 diabetes mellitus  (HCC) 10/04/2017   Aortic ectasia, thoracic 06/12/2015   Coronary artery disease involving native coronary artery of native heart 06/12/2015   Hypertension associated with diabetes (HCC) 06/12/2015   Hyperlipidemia associated with type 2 diabetes mellitus (HCC) 06/12/2015    PCP: Myrla Jon HERO, MD   REFERRING PROVIDER: Myrla Jon HERO, MD   REFERRING DIAG:  Postpolio syndrome [facility]) Chronic bilateral low back pain without sciatica  Rationale for Evaluation and Treatment: Rehabilitation  THERAPY DIAG:  Other low back pain  Other symptoms and signs involving the nervous system  ONSET DATE: 2002 original back injury, current episode 4-5 years prior to PT evaluation  SUBJECTIVE:  SUBJECTIVE STATEMENT: Pt states he is    PERTINENT HISTORY:  Patient is a 79 y.o. male who presents to outpatient physical therapy with a referral for medical diagnosis Postpolio syndrome (HCC), Chronic bilateral low back pain without sciatica. This patient's chief complaints consist of low back pain, leading to the following functional deficits: forward bending, exercise, activity level. Relevant past medical history and comorbidities include the following: he has Aortic ectasia, thoracic; Coronary artery disease involving native coronary artery of native heart; Hypertension associated with diabetes (HCC); Hyperlipidemia associated with type 2 diabetes mellitus (HCC); COPD (chronic obstructive pulmonary disease) (HCC); Esophageal reflux; Type 2 diabetes mellitus (HCC); BPH with urinary obstruction; Angina pectoris; Hearing loss; Hypertensive heart disease without congestive heart failure; Postpolio syndrome (HCC); Persistent atrial fibrillation (HCC); Chronic back pain; and Left inguinal hernia on their problem list. he   has a past medical history of Angina pectoris (05/14/2020), Aortic ectasia, thoracic (06/12/2015), BPH with urinary obstruction (11/27/2019), COPD (chronic obstructive pulmonary disease) (HCC) (10/04/2017), Coronary artery disease involving native coronary artery of native heart (06/12/2015), Enlarged prostate (05/14/2020), Esophageal reflux (10/04/2017), Essential hypertension (06/12/2015), Hearing loss (05/14/2020), Hyperlipidemia LDL goal <100 (06/12/2015), Hypertension, Hypertensive heart disease without congestive heart failure (05/14/2020), Myocardial infarction (HCC), Polio, Postpolio syndrome (HCC) (05/14/2020), and Type 2 diabetes mellitus (HCC) (10/04/2017). he  has a past surgical history that includes Coronary artery bypass graft (1996); Cardiac catheterization (2011); Back surgery (2002); Transurethral resection of prostate (N/A, 11/27/2019); and Cardioversion (N/A, 02/25/2024).  PAIN:   Current pain NPRS = 0/10  From initial PT eval 09/19/24: Are you having pain? Yes, mild discomfort NPRS: Current: 1/10,  Best: 1/10, Worst: 5/10. Pain location: left side low back  Pain description: achey, centralized pain N/T: None present   Aggravating factors: bending over Relieving factors: tylenol     PRECAUTIONS: None  RED FLAGS:  Patient denies hx of cancer, stroke, seizures, unexplained weight loss, unexplained changes in bowel or bladder problems, unexplained stumbling or dropping things, osteoporosis, and spinal surgery Pt presents with: COPD, Post polio syndrome, cardiac history  WEIGHT BEARING RESTRICTIONS: No  FALLS:  Has patient fallen in last 6 months? No   PATIENT GOALS:  Wants to have his back get better.    OBJECTIVE:    TREATMENT  Therapeutic exercise: therapeutic exercises that incorporate ONE parameter at one or more areas of the body to centralize symptoms, develop strength and endurance, range of motion, and flexibility required for successful completion of  functional activities.  Lower Trunk Rotations to improve ROM and increasing flexibility  1 x 20 on each side  Added to HEP  Seated leg lifts over small towel roll on plinth to improve hip flexor and quadriceps strength to support functional mobility  3 x 10 on each side   Knee to chest supine on plinth to improve lumbar flexion mobility and decrease low back stiffness   3 x 10 each side   Hook-lying marches on plinth to improve core strength and trunk stability    3 x 10 each side           Pt required multimodal cuing for proper technique and to facilitate improved neuromuscular control, strength, range of motion, and functional ability resulting in improved performance and form.   PATIENT EDUCATION:  Education details: Exercise purpose/form. Self management techniques.HEP including handout. Person educated: Patient and wife Arland Education method: Explanation, Demonstration, and Handouts Education comprehension: verbalized understanding and returned demonstration  HOME EXERCISE PROGRAM: Access Code: [email or web address] URL: [email or web address] Date: 09/21/2024 Prepared  by: Vernell Mariscal  Exercises - Supine Posterior Pelvic Tilt  - 1 x daily - 3 sets - 10 reps - 3 sec hold - Lower Trunk Rotations  - 1-2 x daily - 5-7 x weekly - 3 sets - 20 reps  ASSESSMENT:  CLINICAL IMPRESSION: Today's session focused on   OBJECTIVE IMPAIRMENTS: decreased activity tolerance, decreased mobility, difficulty walking, decreased ROM, decreased strength, impaired flexibility, improper body mechanics, pain, and poor motor control.   ACTIVITY LIMITATIONS: lifting, bending, caring for others, and picking things up from the ground, looking after his dog, walking long distances with is wife  PARTICIPATION LIMITATIONS: cleaning, laundry, interpersonal relationship, shopping, community activity, yard work, and nurse, mental health walking  PERSONAL FACTORS: Age, Fitness, Past/current experiences, Time  since onset of injury/illness/exacerbation, and 3+ comorbidities:  COPD, cardiac history, prior lumbar surgery, hx of afib, left inguinal hernia, hearing loss,  are also affecting patient's functional outcome.   REHAB POTENTIAL: Good  CLINICAL DECISION MAKING: Stable/uncomplicated  EVALUATION COMPLEXITY: Low   GOALS: Goals reviewed with patient? No  SHORT TERM GOALS: Target date: 10/03/2024  Patient will be independent with initial home exercise program for self-management of symptoms. Baseline: Initial HEP provided at IE (09/19/24); Goal status: In progress  LONG TERM GOALS: Target date: 12/12/2024  Patient will be independent with a long-term home exercise program for self-management of symptoms.  Baseline: Initial HEP provided at IE (09/19/24); Goal status: In progress  2.  Patient will demonstrate improved Modified Oswestry Disability Index (mODI) to equal or less than 10% to demonstrate improvement in overall condition and self-reported functional ability.  Baseline: 32% (09/19/24); Goal status: In progress  3.  Patient will demonstrate increased motion in forward bending to equal or greater than his usual baseline to improve his ability to complete ADL and retreive things from the grounds Baseline: fingers just below knees (09/19/24); Goal status: In progress  4.  Patient will be able to perform 5TSTS in equal or less than 12.6 seconds, which is the age matched norm for a health community dwelling adult, and with show good LE strength/power for functional activities. Baseline: to be measured at visit 2 as appropriate (09/19/24); 14.24 seconds (09/21/2024);  Goal status: In n progress  5.  Patient will demonstrate improvement in Patient Specific Functional Scale (PSFS) of equal or greater than 8/10 points to reflect clinically significant improvement in patient's most valued functional activities. Baseline: to be measured at visit 2 as appropriate (09/19/24); Goal status: In  progress  6. Patient will demonstrate the ability to ambulate equal or greater than 1729 which is his age matched norm for a health community dwelling adult male.  Baseline: to be measured at visit 2 as appropriate (09/19/24); 1226 ft (09/21/2024); Goal status: In progress   PLAN:  PT FREQUENCY: 2x/week  PT DURATION: 8-12 weeks  PLANNED INTERVENTIONS: 97164- PT Re-evaluation, 97750- Physical Performance Testing, 97110-Therapeutic exercises, 97530- Therapeutic activity, [id number]- Neuromuscular re-education, 97535- Self Care, 02859- Manual therapy, G0283- Electrical stimulation (unattended), 20560 (1-2 muscles), 20561 (3+ muscles)- Dry Needling, Patient/Family education, Cryotherapy, and Moist heat.  PLAN FOR NEXT SESSION: Consider continuing with joint mobs, Review HEP, perform PSFS, Consider checking forward flexion to help adjust form as needed, Continue working on exercises to improve mobility and flexibility, motor control, and physical conditioning.   Vernell Mariscal SPT  Student physical therapist under direct supervision of licensed physical therapists during the entirety of the session.   Camie SAUNDERS. Juli, PT, DPT, Cert. MDT, PRA-C 10/03/24, 8:42 AM  [facility] [address] [place], [place] [address] P: [phone] I F: [phone]  "

## 2024-10-04 NOTE — Therapy (Unsigned)
 " OUTPATIENT PHYSICAL THERAPY TREATMENT   Patient Name: Edward Russo MRN: [id number] DOB:13-Mar-1946, 79 y.o., male Today's Date: 10/05/2024  END OF SESSION:  PT End of Session - 10/05/24 1432     Visit Number 4    Number of Visits 17    Date for Recertification  12/12/24    Authorization Type [employer] reporting period from 09/19/2024    PT Start Time 1430    PT Stop Time 1510    PT Time Calculation (min) 40 min    Activity Tolerance Patient tolerated treatment well;No increased pain    Behavior During Therapy [facility] for tasks assessed/performed             Past Medical History:  Diagnosis Date   Angina pectoris 05/14/2020   Aortic ectasia, thoracic 06/12/2015   BPH with urinary obstruction 11/27/2019   COPD (chronic obstructive pulmonary disease) (HCC) 10/04/2017   Coronary artery disease involving native coronary artery of native heart 06/12/2015   Enlarged prostate 05/14/2020   Esophageal reflux 10/04/2017   Essential hypertension 06/12/2015   Hearing loss 05/14/2020   Hyperlipidemia LDL goal <100 06/12/2015   Hypertension    Hypertensive heart disease without congestive heart failure 05/14/2020   Myocardial infarction (HCC)    Polio    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   Type 2 diabetes mellitus (HCC) 10/04/2017   Past Surgical History:  Procedure Laterality Date   BACK SURGERY  2002   lower   CARDIAC CATHETERIZATION  2011   patent LTA and RTA at cath in 2011    CARDIOVERSION N/A 02/25/2024   Procedure: CARDIOVERSION;  Surgeon: Darliss Rogue, MD;  Location: [facility];  Service: Cardiovascular;  Laterality: N/A;   CORONARY ARTERY BYPASS GRAFT  1996   TRANSURETHRAL RESECTION OF PROSTATE N/A 11/27/2019   Procedure: TRANSURETHRAL RESECTION OF THE PROSTATE (TURP);  Surgeon: Ottelin, Mark, MD;  Location: [facility];  Service: Urology;  Laterality: N/A;   Patient Active Problem List   Diagnosis Date Noted   Left inguinal hernia  09/14/2024   Chronic back pain 03/14/2024   Persistent atrial fibrillation (HCC) 02/25/2024   Angina pectoris 05/14/2020   Hearing loss 05/14/2020   Hypertensive heart disease without congestive heart failure 05/14/2020   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   BPH with urinary obstruction 11/27/2019   COPD (chronic obstructive pulmonary disease) (HCC) 10/04/2017   Esophageal reflux 10/04/2017   Type 2 diabetes mellitus (HCC) 10/04/2017   Aortic ectasia, thoracic 06/12/2015   Coronary artery disease involving native coronary artery of native heart 06/12/2015   Hypertension associated with diabetes (HCC) 06/12/2015   Hyperlipidemia associated with type 2 diabetes mellitus (HCC) 06/12/2015    PCP: Myrla Jon HERO, MD   REFERRING PROVIDER: Myrla Jon HERO, MD   REFERRING DIAG:  Postpolio syndrome [facility]) Chronic bilateral low back pain without sciatica  Rationale for Evaluation and Treatment: Rehabilitation  THERAPY DIAG:  Other low back pain  Other symptoms and signs involving the nervous system  ONSET DATE: 2002 original back injury, current episode 4-5 years prior to PT evaluation  SUBJECTIVE:  SUBJECTIVE STATEMENT:   Patient reports pain is feeling much better and he's been doing good. He has been completing his HEP. He's been avoiding reaching excessively to decrease risk of exacerbating sx.    PERTINENT HISTORY:  Patient is a 79 y.o. male who presents to outpatient physical therapy with a referral for medical diagnosis Postpolio syndrome (HCC), Chronic bilateral low back pain without sciatica. This patient's chief complaints consist of low back pain, leading to the following functional deficits: forward bending, exercise, activity level. Relevant past medical history and comorbidities  include the following: he has Aortic ectasia, thoracic; Coronary artery disease involving native coronary artery of native heart; Hypertension associated with diabetes (HCC); Hyperlipidemia associated with type 2 diabetes mellitus (HCC); COPD (chronic obstructive pulmonary disease) (HCC); Esophageal reflux; Type 2 diabetes mellitus (HCC); BPH with urinary obstruction; Angina pectoris; Hearing loss; Hypertensive heart disease without congestive heart failure; Postpolio syndrome (HCC); Persistent atrial fibrillation (HCC); Chronic back pain; and Left inguinal hernia on their problem list. he  has a past medical history of Angina pectoris (05/14/2020), Aortic ectasia, thoracic (06/12/2015), BPH with urinary obstruction (11/27/2019), COPD (chronic obstructive pulmonary disease) (HCC) (10/04/2017), Coronary artery disease involving native coronary artery of native heart (06/12/2015), Enlarged prostate (05/14/2020), Esophageal reflux (10/04/2017), Essential hypertension (06/12/2015), Hearing loss (05/14/2020), Hyperlipidemia LDL goal <100 (06/12/2015), Hypertension, Hypertensive heart disease without congestive heart failure (05/14/2020), Myocardial infarction (HCC), Polio, Postpolio syndrome (HCC) (05/14/2020), and Type 2 diabetes mellitus (HCC) (10/04/2017). he  has a past surgical history that includes Coronary artery bypass graft (1996); Cardiac catheterization (2011); Back surgery (2002); Transurethral resection of prostate (N/A, 11/27/2019); and Cardioversion (N/A, 02/25/2024).  PAIN:   Current pain NPRS = 1/10  From initial PT eval 09/19/24: Are you having pain? Yes, mild discomfort NPRS: Current: 1/10,  Best: 1/10, Worst: 5/10. Pain location: left side low back  Pain description: achey, centralized pain N/T: None present   Aggravating factors: bending over Relieving factors: tylenol     PRECAUTIONS: None  RED FLAGS:  Patient denies hx of cancer, stroke, seizures, unexplained weight loss,  unexplained changes in bowel or bladder problems, unexplained stumbling or dropping things, osteoporosis, and spinal surgery Pt presents with: COPD, Post polio syndrome, cardiac history  WEIGHT BEARING RESTRICTIONS: No  FALLS:  Has patient fallen in last 6 months? No   PATIENT GOALS:  Wants to have his back get better.    OBJECTIVE:    TREATMENT 10/05/24:   Therapeutic exercise: therapeutic exercises that incorporate ONE parameter at one or more areas of the body to centralize symptoms, develop strength and endurance, range of motion, and flexibility required for successful completion of functional activities.    Nu-step bike to improve endurance and LE strength   Duration: 6 minutes   Seat and handle level 10   SPM goal: >70    Seated forward flexion with yoga ball to improve ROM and flexibility   3 x 30 second holds    Lateral trunk stretch with yoga ball to improve ROM and flexibility   3 x 30 second each side   Supine Lower Trunk Rotations with weighted ball to improve core strength  3 x 10    Supine leg lifts over small towel roll on plinth to improve hip flexor and quadriceps strength to support functional mobility  3 x 10 on each side    Knee to chest supine on plinth to improve lumbar flexion mobility and decrease low back stiffness  3 x 30 seconds each side    Hook-lying marches on plinth to improve core strength and trunk stability              3 x 10 each side - with GTB around knees   Seated marching to improve hip flexor and core strength   2 x 10 each side with GTB   Pt required multimodal cuing for proper technique and to facilitate improved neuromuscular control, strength, range of motion, and functional ability resulting in improved performance and form.   PATIENT EDUCATION:  Education details: Exercise purpose/form. Self management techniques.HEP including handout. Person educated: Patient and wife Arland Education method:  Explanation, Demonstration, and Handouts Education comprehension: verbalized understanding and returned demonstration  HOME EXERCISE PROGRAM: Access Code: [email or web address] URL: [email or web address] Date: 09/21/2024 Prepared by: Vernell Mariscal  Exercises - Supine Posterior Pelvic Tilt  - 1 x daily - 3 sets - 10 reps - 3 sec hold - Lower Trunk Rotations  - 1-2 x daily - 5-7 x weekly - 3 sets - 20 reps  ASSESSMENT:  CLINICAL IMPRESSION:    Today's session focused on continuing to address hypomobility and stiffness in low back. Patient arrived feeling in high spirits. He began Nu-step bike warmup and seemed to enjoy the exercise putting him in a positive mindset for the rest of tx. Today was the first tx session his wife did not come back with him. Patient expressed feeling a little sore after tx session but no increase in pain. He tolerated all exercises well. Patient would benefit from continued management of limiting condition by skilled physical therapist to address remaining impairments and functional limitations to work towards stated goals and return to PLOF or maximal functional independence.    OBJECTIVE IMPAIRMENTS: decreased activity tolerance, decreased mobility, difficulty walking, decreased ROM, decreased strength, impaired flexibility, improper body mechanics, pain, and poor motor control.   ACTIVITY LIMITATIONS: lifting, bending, caring for others, and picking things up from the ground, looking after his dog, walking long distances with is wife  PARTICIPATION LIMITATIONS: cleaning, laundry, interpersonal relationship, shopping, community activity, yard work, and nurse, mental health walking  PERSONAL FACTORS: Age, Fitness, Past/current experiences, Time since onset of injury/illness/exacerbation, and 3+ comorbidities:  COPD, cardiac history, prior lumbar surgery, hx of afib, left inguinal hernia, hearing loss,  are also affecting patient's functional outcome.   REHAB POTENTIAL:  Good  CLINICAL DECISION MAKING: Stable/uncomplicated  EVALUATION COMPLEXITY: Low   GOALS: Goals reviewed with patient? No  SHORT TERM GOALS: Target date: 10/03/2024  Patient will be independent with initial home exercise program for self-management of symptoms. Baseline: Initial HEP provided at IE (09/19/24); Goal status: In progress  LONG TERM GOALS: Target date: 12/12/2024  Patient will be independent with a long-term home exercise program for self-management of symptoms.  Baseline: Initial HEP provided at IE (09/19/24); Goal status: In progress  2.  Patient will demonstrate improved Modified Oswestry Disability Index (mODI) to equal or less than 10% to demonstrate improvement in overall condition and self-reported functional ability.  Baseline: 32% (09/19/24); Goal status: In progress  3.  Patient will demonstrate increased motion in forward bending to equal or greater than his usual baseline to improve his ability to complete ADL and retreive things from the grounds Baseline: fingers just below knees (09/19/24); Goal status: In progress  4.  Patient will be able to perform 5TSTS in equal or less than 12.6 seconds, which is the age matched norm for a health community dwelling adult, and with show good  LE strength/power for functional activities. Baseline: to be measured at visit 2 as appropriate (09/19/24); 14.24 seconds (09/21/2024);  Goal status: In n progress  5.  Patient will demonstrate improvement in Patient Specific Functional Scale (PSFS) of equal or greater than 8/10 points to reflect clinically significant improvement in patient's most valued functional activities. Baseline: to be measured at visit 2 as appropriate (09/19/24); Goal status: In progress  6. Patient will demonstrate the ability to ambulate equal or greater than 1729 which is his age matched norm for a health community dwelling adult male.  Baseline: to be measured at visit 2 as appropriate (09/19/24);  1226 ft (09/21/2024); Goal status: In progress   PLAN:  PT FREQUENCY: 2x/week  PT DURATION: 8-12 weeks  PLANNED INTERVENTIONS: 97164- PT Re-evaluation, 97750- Physical Performance Testing, 97110-Therapeutic exercises, 97530- Therapeutic activity, [id number]- Neuromuscular re-education, 97535- Self Care, [id number]- Manual therapy, [id number]- Electrical stimulation (unattended), 20560 (1-2 muscles), 20561 (3+ muscles)- Dry Needling, Patient/Family education, Cryotherapy, and Moist heat.  PLAN FOR NEXT SESSION: Consider adjusting HEP, perform PSFS, Continue working on exercises to improve mobility and flexibility, motor control, and physical conditioning. Consider hip strengthening exercises.  Vernell Mariscal SPT   Student physical therapist under direct supervision of licensed physical therapists during the entirety of the session.   Laymon GORMAN Perfect, PT, DT Physical Therapist - [facility]   10/05/24, 3:20 PM "

## 2024-10-05 ENCOUNTER — Ambulatory Visit

## 2024-10-05 DIAGNOSIS — M5459 Other low back pain: Secondary | ICD-10-CM

## 2024-10-05 DIAGNOSIS — R29818 Other symptoms and signs involving the nervous system: Secondary | ICD-10-CM

## 2024-10-06 ENCOUNTER — Other Ambulatory Visit: Payer: Self-pay | Admitting: Family Medicine

## 2024-10-11 ENCOUNTER — Ambulatory Visit: Admitting: Physical Therapy

## 2024-10-18 ENCOUNTER — Ambulatory Visit: Admitting: Physical Therapy

## 2024-10-23 ENCOUNTER — Ambulatory Visit: Admitting: Physical Therapy

## 2024-10-25 ENCOUNTER — Ambulatory Visit: Admitting: Physical Therapy

## 2024-10-31 ENCOUNTER — Ambulatory Visit: Admitting: Physical Therapy

## 2024-11-02 ENCOUNTER — Ambulatory Visit: Admitting: Physical Therapy

## 2024-11-06 ENCOUNTER — Ambulatory Visit: Admitting: Physical Therapy

## 2024-11-08 ENCOUNTER — Ambulatory Visit: Admitting: Physical Therapy

## 2024-11-14 ENCOUNTER — Ambulatory Visit: Admitting: Physical Therapy

## 2024-11-16 ENCOUNTER — Ambulatory Visit: Admitting: Physical Therapy

## 2024-11-21 ENCOUNTER — Ambulatory Visit: Admitting: Physical Therapy

## 2024-11-23 ENCOUNTER — Ambulatory Visit: Admitting: Physical Therapy

## 2024-11-28 ENCOUNTER — Ambulatory Visit: Admitting: Physical Therapy

## 2024-11-30 ENCOUNTER — Ambulatory Visit: Admitting: Physical Therapy

## 2025-03-15 ENCOUNTER — Ambulatory Visit: Admitting: Family Medicine
